# Patient Record
Sex: Female | Born: 1955 | Race: Black or African American | Hispanic: No | Marital: Single | Smoking: Former smoker
Health system: Southern US, Community
[De-identification: ages and names within clinical notes are randomized; demographics above are authoritative.]

## PROBLEM LIST (undated history)

## (undated) DIAGNOSIS — K219 Gastro-esophageal reflux disease without esophagitis: Secondary | ICD-10-CM

## (undated) DIAGNOSIS — F419 Anxiety disorder, unspecified: Secondary | ICD-10-CM

## (undated) DIAGNOSIS — J45909 Unspecified asthma, uncomplicated: Secondary | ICD-10-CM

## (undated) DIAGNOSIS — M1990 Unspecified osteoarthritis, unspecified site: Secondary | ICD-10-CM

## (undated) DIAGNOSIS — I10 Essential (primary) hypertension: Secondary | ICD-10-CM

## (undated) HISTORY — PX: CARPAL TUNNEL RELEASE: SHX101

## (undated) HISTORY — PX: ECTOPIC PREGNANCY SURGERY: SHX613

## (undated) HISTORY — PX: TUBAL LIGATION: SHX77

## (undated) HISTORY — PX: BUNIONECTOMY: SHX129

---

## 2006-07-01 ENCOUNTER — Ambulatory Visit: Payer: Self-pay | Admitting: Unknown Physician Specialty

## 2006-07-13 ENCOUNTER — Ambulatory Visit: Payer: Self-pay | Admitting: Unknown Physician Specialty

## 2007-09-02 ENCOUNTER — Ambulatory Visit: Payer: Self-pay | Admitting: Unknown Physician Specialty

## 2008-01-21 ENCOUNTER — Ambulatory Visit: Payer: Self-pay | Admitting: Gastroenterology

## 2008-02-21 ENCOUNTER — Other Ambulatory Visit: Payer: Self-pay

## 2008-02-21 ENCOUNTER — Ambulatory Visit: Payer: Self-pay | Admitting: Podiatry

## 2008-02-25 ENCOUNTER — Ambulatory Visit: Payer: Self-pay | Admitting: Podiatry

## 2008-04-03 ENCOUNTER — Ambulatory Visit: Payer: Self-pay | Admitting: Podiatry

## 2008-04-07 ENCOUNTER — Ambulatory Visit: Payer: Self-pay | Admitting: Podiatry

## 2009-03-15 ENCOUNTER — Ambulatory Visit: Payer: Self-pay | Admitting: Unknown Physician Specialty

## 2010-12-10 ENCOUNTER — Ambulatory Visit: Payer: Self-pay | Admitting: Unknown Physician Specialty

## 2010-12-13 ENCOUNTER — Ambulatory Visit: Payer: Self-pay | Admitting: Unknown Physician Specialty

## 2012-01-01 ENCOUNTER — Ambulatory Visit: Payer: Self-pay | Admitting: Unknown Physician Specialty

## 2013-04-19 ENCOUNTER — Ambulatory Visit: Payer: Self-pay | Admitting: Physician Assistant

## 2013-06-17 ENCOUNTER — Ambulatory Visit: Payer: Self-pay | Admitting: Gastroenterology

## 2015-05-04 ENCOUNTER — Other Ambulatory Visit: Payer: Self-pay | Admitting: Physician Assistant

## 2015-05-04 ENCOUNTER — Ambulatory Visit
Admission: RE | Admit: 2015-05-04 | Discharge: 2015-05-04 | Disposition: A | Discharge status: Home / Self Care | Payer: 59 | Source: Ambulatory Visit | Attending: Physician Assistant | Admitting: Physician Assistant

## 2015-05-04 DIAGNOSIS — Z1231 Encounter for screening mammogram for malignant neoplasm of breast: Secondary | ICD-10-CM | POA: Diagnosis not present

## 2015-05-08 ENCOUNTER — Ambulatory Visit: Payer: Self-pay

## 2016-05-15 ENCOUNTER — Other Ambulatory Visit: Payer: Self-pay | Admitting: Physician Assistant

## 2016-05-15 DIAGNOSIS — Z1231 Encounter for screening mammogram for malignant neoplasm of breast: Secondary | ICD-10-CM

## 2016-06-16 ENCOUNTER — Other Ambulatory Visit: Payer: Self-pay | Admitting: Physician Assistant

## 2016-06-16 ENCOUNTER — Ambulatory Visit
Admission: RE | Admit: 2016-06-16 | Discharge: 2016-06-16 | Disposition: A | Discharge status: Home / Self Care | Payer: 59 | Source: Ambulatory Visit | Attending: Physician Assistant | Admitting: Physician Assistant

## 2016-06-16 DIAGNOSIS — Z1231 Encounter for screening mammogram for malignant neoplasm of breast: Secondary | ICD-10-CM | POA: Diagnosis present

## 2017-05-28 ENCOUNTER — Other Ambulatory Visit: Payer: Self-pay | Admitting: Physician Assistant

## 2017-05-28 DIAGNOSIS — Z1231 Encounter for screening mammogram for malignant neoplasm of breast: Secondary | ICD-10-CM

## 2017-07-01 ENCOUNTER — Ambulatory Visit
Admission: RE | Admit: 2017-07-01 | Discharge: 2017-07-01 | Disposition: A | Discharge status: Home / Self Care | Payer: 59 | Source: Ambulatory Visit | Attending: Physician Assistant | Admitting: Physician Assistant

## 2017-07-01 DIAGNOSIS — Z1231 Encounter for screening mammogram for malignant neoplasm of breast: Secondary | ICD-10-CM | POA: Diagnosis present

## 2017-10-19 ENCOUNTER — Encounter: Payer: Self-pay | Admitting: Emergency Medicine

## 2017-10-19 ENCOUNTER — Emergency Department
Admission: EM | Admit: 2017-10-19 | Discharge: 2017-10-19 | Disposition: A | Discharge status: Home / Self Care | Payer: 59 | Attending: Emergency Medicine | Admitting: Emergency Medicine

## 2017-10-19 ENCOUNTER — Emergency Department: Payer: 59

## 2017-10-19 DIAGNOSIS — M25511 Pain in right shoulder: Secondary | ICD-10-CM | POA: Insufficient documentation

## 2017-10-19 DIAGNOSIS — I10 Essential (primary) hypertension: Secondary | ICD-10-CM | POA: Diagnosis not present

## 2017-10-19 DIAGNOSIS — M7918 Myalgia, other site: Secondary | ICD-10-CM

## 2017-10-19 DIAGNOSIS — Z87891 Personal history of nicotine dependence: Secondary | ICD-10-CM | POA: Diagnosis not present

## 2017-10-19 DIAGNOSIS — J45909 Unspecified asthma, uncomplicated: Secondary | ICD-10-CM | POA: Diagnosis not present

## 2017-10-19 DIAGNOSIS — M542 Cervicalgia: Secondary | ICD-10-CM | POA: Diagnosis not present

## 2017-10-19 HISTORY — DX: Essential (primary) hypertension: I10

## 2017-10-19 HISTORY — DX: Unspecified asthma, uncomplicated: J45.909

## 2017-10-19 MED ORDER — CYCLOBENZAPRINE HCL 10 MG PO TABS: 10.0000 mg | ORAL_TABLET | Freq: Three times a day (TID) | ORAL | 0 refills | Status: DC | PRN

## 2017-10-19 MED ORDER — IBUPROFEN 600 MG PO TABS
600.0000 mg | ORAL_TABLET | Freq: Once | ORAL | Status: AC
  Administered 2017-10-19: 600 mg via ORAL
  Filled 2017-10-19: qty 1

## 2017-10-19 MED ORDER — IBUPROFEN 600 MG PO TABS: 600.0000 mg | ORAL_TABLET | Freq: Three times a day (TID) | ORAL | 0 refills | Status: DC | PRN

## 2017-10-19 MED ORDER — CYCLOBENZAPRINE HCL 10 MG PO TABS
10.0000 mg | ORAL_TABLET | Freq: Once | ORAL | Status: AC
  Administered 2017-10-19: 10 mg via ORAL
  Filled 2017-10-19: qty 1
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
## 2017-10-19 NOTE — ED Notes (Signed)
Pt verbalizes d/c understanding and rx given. PT in NAD at time of d/c, pt in wheelchair with family to lobby . PT VS stable
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
## 2017-11-27 ENCOUNTER — Other Ambulatory Visit: Payer: Self-pay | Admitting: Student

## 2017-11-27 DIAGNOSIS — M5412 Radiculopathy, cervical region: Secondary | ICD-10-CM

## 2017-12-04 ENCOUNTER — Telehealth: Payer: Self-pay | Admitting: Student

## 2017-12-07 ENCOUNTER — Ambulatory Visit
Admission: RE | Admit: 2017-12-07 | Discharge: 2017-12-07 | Disposition: A | Discharge status: Home / Self Care | Payer: No Typology Code available for payment source | Source: Ambulatory Visit | Attending: Student | Admitting: Student

## 2018-02-10 ENCOUNTER — Other Ambulatory Visit: Payer: Self-pay | Admitting: Physical Medicine and Rehabilitation

## 2018-02-10 DIAGNOSIS — M5412 Radiculopathy, cervical region: Secondary | ICD-10-CM

## 2018-02-20 ENCOUNTER — Ambulatory Visit
Admission: RE | Admit: 2018-02-20 | Discharge: 2018-02-20 | Disposition: A | Discharge status: Home / Self Care | Payer: Managed Care, Other (non HMO) | Source: Ambulatory Visit | Attending: Physical Medicine and Rehabilitation | Admitting: Physical Medicine and Rehabilitation

## 2018-02-20 DIAGNOSIS — M4683 Other specified inflammatory spondylopathies, cervicothoracic region: Secondary | ICD-10-CM | POA: Insufficient documentation

## 2018-02-20 DIAGNOSIS — M5412 Radiculopathy, cervical region: Secondary | ICD-10-CM | POA: Diagnosis present

## 2018-02-20 DIAGNOSIS — M50223 Other cervical disc displacement at C6-C7 level: Secondary | ICD-10-CM | POA: Diagnosis not present

## 2018-02-20 DIAGNOSIS — M4803 Spinal stenosis, cervicothoracic region: Secondary | ICD-10-CM | POA: Diagnosis not present

## 2018-02-20 DIAGNOSIS — M4802 Spinal stenosis, cervical region: Secondary | ICD-10-CM | POA: Insufficient documentation

## 2018-02-20 DIAGNOSIS — M47812 Spondylosis without myelopathy or radiculopathy, cervical region: Secondary | ICD-10-CM | POA: Diagnosis not present

## 2018-02-20 DIAGNOSIS — M542 Cervicalgia: Secondary | ICD-10-CM | POA: Diagnosis present

## 2018-04-20 ENCOUNTER — Other Ambulatory Visit: Payer: Self-pay

## 2018-04-20 ENCOUNTER — Encounter
Admission: RE | Admit: 2018-04-20 | Discharge: 2018-04-20 | Disposition: A | Discharge status: Home / Self Care | Payer: Managed Care, Other (non HMO) | Source: Ambulatory Visit | Attending: Neurosurgery | Admitting: Neurosurgery

## 2018-04-20 DIAGNOSIS — Z01812 Encounter for preprocedural laboratory examination: Secondary | ICD-10-CM | POA: Insufficient documentation

## 2018-04-20 DIAGNOSIS — Z01818 Encounter for other preprocedural examination: Secondary | ICD-10-CM | POA: Diagnosis not present

## 2018-04-20 DIAGNOSIS — R001 Bradycardia, unspecified: Secondary | ICD-10-CM | POA: Diagnosis not present

## 2018-04-20 DIAGNOSIS — Z0183 Encounter for blood typing: Secondary | ICD-10-CM | POA: Diagnosis not present

## 2018-04-20 HISTORY — DX: Anxiety disorder, unspecified: F41.9

## 2018-04-20 HISTORY — DX: Unspecified osteoarthritis, unspecified site: M19.90

## 2018-04-20 HISTORY — DX: Gastro-esophageal reflux disease without esophagitis: K21.9

## 2018-04-20 LAB — TYPE AND SCREEN
ABO/RH(D): O POS
ANTIBODY SCREEN: NEGATIVE

## 2018-04-20 LAB — URINALYSIS, COMPLETE (UACMP) WITH MICROSCOPIC
BACTERIA UA: NONE SEEN
BILIRUBIN URINE: NEGATIVE
Glucose, UA: NEGATIVE mg/dL
Hgb urine dipstick: NEGATIVE
Ketones, ur: NEGATIVE mg/dL
LEUKOCYTES UA: NEGATIVE
Nitrite: NEGATIVE
PROTEIN: 30 mg/dL — AB
SPECIFIC GRAVITY, URINE: 1.023 (ref 1.005–1.030)
pH: 7 (ref 5.0–8.0)

## 2018-04-20 LAB — CBC
HEMATOCRIT: 39 % (ref 35.0–47.0)
Hemoglobin: 13.4 g/dL (ref 12.0–16.0)
MCH: 31.6 pg (ref 26.0–34.0)
MCHC: 34.4 g/dL (ref 32.0–36.0)
MCV: 91.9 fL (ref 80.0–100.0)
Platelets: 220 10*3/uL (ref 150–440)
RBC: 4.24 MIL/uL (ref 3.80–5.20)
RDW: 14 % (ref 11.5–14.5)
WBC: 4.9 10*3/uL (ref 3.6–11.0)

## 2018-04-20 LAB — BASIC METABOLIC PANEL
Anion gap: 5 (ref 5–15)
BUN: 14 mg/dL (ref 6–20)
CALCIUM: 9 mg/dL (ref 8.9–10.3)
CO2: 30 mmol/L (ref 22–32)
CREATININE: 0.9 mg/dL (ref 0.44–1.00)
Chloride: 107 mmol/L (ref 101–111)
GFR calc non Af Amer: >60 mL/min (ref >60)
Glucose, Bld: 102 mg/dL — ABNORMAL HIGH (ref 65–99)
Potassium: 4.1 mmol/L (ref 3.5–5.1)
Sodium: 142 mmol/L (ref 135–145)

## 2018-04-20 LAB — APTT: aPTT: 27 seconds (ref 24–36)

## 2018-04-20 LAB — SURGICAL PCR SCREEN
MRSA, PCR: NEGATIVE
Staphylococcus aureus: NEGATIVE

## 2018-04-20 LAB — PROTIME-INR
INR: 0.87
Prothrombin Time: 11.7 seconds (ref 11.4–15.2)

## 2018-04-20 NOTE — Patient Instructions (Addendum)
Your procedure is scheduled on: 04/28/18 Wed Report to Same Day Surgery 2nd floor medical mall [facility] Entrance-take elevator on left to 2nd floor.  Check in with surgery information desk.) To find out your arrival time please call [phone] between 1PM - 3PM on 04/27/18 Tues  Remember: Instructions that are not followed completely may result in serious medical risk, up to and including death, or upon the discretion of your surgeon and anesthesiologist your surgery may need to be rescheduled.    _x___ 1. Do not eat food after midnight the night before your procedure. You may drink clear liquids up to 2 hours before you are scheduled to arrive at the hospital for your procedure.  Do not drink clear liquids within 2 hours of your scheduled arrival to the hospital.  Clear liquids include  --Water or Apple juice without pulp  --Clear carbohydrate beverage such as ClearFast or Gatorade  --Black Coffee or Clear Tea (No milk, no creamers, do not add anything to                  the coffee or Tea Type 1 and type 2 diabetics should only drink water.  No gum chewing or hard candies.     __x__ 2. No Alcohol for 24 hours before or after surgery.   __x__3. No Smoking or e-cigarettes for 24 prior to surgery.  Do not use any chewable tobacco products for at least 6 hour prior to surgery   ____  4. Bring all medications with you on the day of surgery if instructed.    __x__ 5. Notify your doctor if there is any change in your medical condition     (cold, fever, infections).    x___6. On the morning of surgery brush your teeth with toothpaste and water.  You may rinse your mouth with mouth wash if you wish.  Do not swallow any toothpaste or mouthwash.   Do not wear jewelry, make-up, hairpins, clips or nail polish.  Do not wear lotions, powders, or perfumes. You may wear deodorant.  Do not shave 48 hours prior to surgery. Men may shave face and neck.  Do not bring valuables to the hospital.     [facility] is not responsible for any belongings or valuables.               Contacts, dentures or bridgework may not be worn into surgery.  Leave your suitcase in the car. After surgery it may be brought to your room.  For patients admitted to the hospital, discharge time is determined by your                       treatment team.  _  Patients discharged the day of surgery will not be allowed to drive home.  You will need someone to drive you home and stay with you the night of your procedure.    Please read over the following fact sheets that you were given:   [facility] Preparing for Surgery and or MRSA Information   _x___ Take anti-hypertensive listed below, cardiac, seizure, asthma,     anti-reflux and psychiatric medicines. These include:  1.None  2.  3.  4.  5.  6.  ____Fleets enema or Magnesium Citrate as directed.   _x___ Use CHG Soap or sage wipes as directed on instruction sheet   ____ Use inhalers on the day of surgery and bring to hospital day of surgery  ____  Stop Metformin and Janumet 2 days prior to surgery.    ____ Take 1/2 of usual insulin dose the night before surgery and none on the morning     surgery.   _x___ Follow recommendations from Cardiologist, Pulmonologist or PCP regarding          stopping Aspirin, Coumadin, Plavix ,Eliquis, Effient, or Pradaxa, and Pletal.  X____Stop Anti-inflammatories such as Advil, Aleve, Ibuprofen, Motrin, Naproxen, Naprosyn, Goodies powders or aspirin products. OK to take Tylenol and                          Celebrex.   _x___ Stop supplements until after surgery.  But may continue Vitamin D, Vitamin B,       and multivitamin.   ____ Bring C-Pap to the hospital.

## 2018-04-27 DIAGNOSIS — Z87891 Personal history of nicotine dependence: Secondary | ICD-10-CM | POA: Diagnosis not present

## 2018-04-27 DIAGNOSIS — Z79891 Long term (current) use of opiate analgesic: Secondary | ICD-10-CM | POA: Diagnosis not present

## 2018-04-27 DIAGNOSIS — I10 Essential (primary) hypertension: Secondary | ICD-10-CM | POA: Diagnosis not present

## 2018-04-27 DIAGNOSIS — J45909 Unspecified asthma, uncomplicated: Secondary | ICD-10-CM | POA: Diagnosis not present

## 2018-04-27 DIAGNOSIS — R202 Paresthesia of skin: Secondary | ICD-10-CM | POA: Diagnosis not present

## 2018-04-27 DIAGNOSIS — M5412 Radiculopathy, cervical region: Secondary | ICD-10-CM | POA: Diagnosis present

## 2018-04-27 DIAGNOSIS — Z79890 Hormone replacement therapy (postmenopausal): Secondary | ICD-10-CM | POA: Diagnosis not present

## 2018-04-27 DIAGNOSIS — M79602 Pain in left arm: Secondary | ICD-10-CM | POA: Diagnosis present

## 2018-04-27 DIAGNOSIS — Z79899 Other long term (current) drug therapy: Secondary | ICD-10-CM | POA: Diagnosis not present

## 2018-04-27 MED ORDER — CEFAZOLIN SODIUM-DEXTROSE 2-4 GM/100ML-% IV SOLN
2.0000 g | INTRAVENOUS | Status: AC
  Administered 2018-04-28: 2 g via INTRAVENOUS

## 2018-04-27 NOTE — Consult Note (Signed)
Pharmacy Antibiotic Note  Zoe Fields is a 62 y.o. female scheduled for surgical procedure on 5/29. Pharmacy has been consulted for cefazolin dosing.  Plan: Will order Cefazolin 2g IV x 1 dose to be given 30 minute prior to surgical procedure. Order has been sign and held for 5/29.    Allergies  Allergen Reactions  . Neomycin     hives  . Sulfa Antibiotics     Redness   . Benazepril   . Eryc [Erythromycin]   . Flexeril [Cyclobenzaprine] Other (See Comments)    Hallucinate  . Hyzaar [Losartan Potassium-Hctz] Swelling    Thank you for allowing pharmacy to be a part of this patient's care.  Gardner Candle, PharmD, BCPS Clinical Pharmacist 04/27/2018 2:31 PM

## 2018-04-28 ENCOUNTER — Other Ambulatory Visit: Payer: Self-pay

## 2018-04-28 ENCOUNTER — Observation Stay: Payer: Managed Care, Other (non HMO)

## 2018-04-28 ENCOUNTER — Inpatient Hospital Stay: Payer: Managed Care, Other (non HMO)

## 2018-04-28 ENCOUNTER — Inpatient Hospital Stay: Payer: Managed Care, Other (non HMO) | Admitting: Anesthesiology

## 2018-04-28 ENCOUNTER — Encounter: Payer: Self-pay | Admitting: *Deleted

## 2018-04-28 ENCOUNTER — Encounter
Admission: RE | Disposition: A | Discharge status: Home / Self Care | Payer: Self-pay | Source: Ambulatory Visit | Attending: Neurosurgery

## 2018-04-28 ENCOUNTER — Observation Stay
Admission: RE | Admit: 2018-04-28 | Discharge: 2018-04-29 | Disposition: A | Discharge status: Home / Self Care | Payer: Managed Care, Other (non HMO) | Source: Ambulatory Visit | Attending: Neurosurgery | Admitting: Neurosurgery

## 2018-04-28 DIAGNOSIS — Z981 Arthrodesis status: Secondary | ICD-10-CM

## 2018-04-28 DIAGNOSIS — Z87891 Personal history of nicotine dependence: Secondary | ICD-10-CM | POA: Insufficient documentation

## 2018-04-28 DIAGNOSIS — Z419 Encounter for procedure for purposes other than remedying health state, unspecified: Secondary | ICD-10-CM

## 2018-04-28 DIAGNOSIS — Z79891 Long term (current) use of opiate analgesic: Secondary | ICD-10-CM | POA: Insufficient documentation

## 2018-04-28 DIAGNOSIS — J45909 Unspecified asthma, uncomplicated: Secondary | ICD-10-CM | POA: Insufficient documentation

## 2018-04-28 DIAGNOSIS — R202 Paresthesia of skin: Secondary | ICD-10-CM | POA: Insufficient documentation

## 2018-04-28 DIAGNOSIS — M5412 Radiculopathy, cervical region: Principal | ICD-10-CM | POA: Diagnosis present

## 2018-04-28 DIAGNOSIS — Z79899 Other long term (current) drug therapy: Secondary | ICD-10-CM | POA: Insufficient documentation

## 2018-04-28 DIAGNOSIS — I10 Essential (primary) hypertension: Secondary | ICD-10-CM | POA: Insufficient documentation

## 2018-04-28 DIAGNOSIS — Z79890 Hormone replacement therapy (postmenopausal): Secondary | ICD-10-CM | POA: Insufficient documentation

## 2018-04-28 HISTORY — PX: ANTERIOR CERVICAL DECOMP/DISCECTOMY FUSION: SHX1161

## 2018-04-28 LAB — ABO/RH: ABO/RH(D): O POS

## 2018-04-28 SURGERY — ANTERIOR CERVICAL DECOMPRESSION/DISCECTOMY FUSION 3 LEVELS
Anesthesia: General

## 2018-04-28 MED ORDER — SODIUM CHLORIDE FLUSH 0.9 % IV SOLN
INTRAVENOUS | Status: AC
  Administered 2018-04-28: 10 mL
  Filled 2018-04-28: qty 10

## 2018-04-28 MED ORDER — EPHEDRINE SULFATE 50 MG/ML IJ SOLN
INTRAMUSCULAR | Status: DC | PRN
  Administered 2018-04-28 (×3): 5 mg via INTRAVENOUS

## 2018-04-28 MED ORDER — MEDROXYPROGESTERONE ACETATE 2.5 MG PO TABS
2.5000 mg | ORAL_TABLET | Freq: Every day | ORAL | Status: DC
  Administered 2018-04-28: 2.5 mg via ORAL
  Filled 2018-04-28 (×2): qty 1

## 2018-04-28 MED ORDER — PHENOL 1.4 % MT LIQD
1.0000 | OROMUCOSAL | Status: DC | PRN
  Administered 2018-04-28: 1 via OROMUCOSAL
  Filled 2018-04-28 (×2): qty 177

## 2018-04-28 MED ORDER — ONDANSETRON HCL 4 MG PO TABS: 4.0000 mg | ORAL_TABLET | Freq: Four times a day (QID) | ORAL | Status: DC | PRN

## 2018-04-28 MED ORDER — MENTHOL 3 MG MT LOZG: 1.0000 | LOZENGE | OROMUCOSAL | Status: DC | PRN

## 2018-04-28 MED ORDER — KETAMINE HCL 50 MG/ML IJ SOLN
INTRAMUSCULAR | Status: AC
  Filled 2018-04-28: qty 10

## 2018-04-28 MED ORDER — ACETAMINOPHEN 650 MG RE SUPP: 650.0000 mg | RECTAL | Status: DC | PRN

## 2018-04-28 MED ORDER — FENTANYL CITRATE (PF) 100 MCG/2ML IJ SOLN: 25.0000 ug | INTRAMUSCULAR | Status: DC | PRN

## 2018-04-28 MED ORDER — ESTRADIOL 1 MG PO TABS
1.0000 mg | ORAL_TABLET | Freq: Every day | ORAL | Status: DC
  Administered 2018-04-28: 1 mg via ORAL
  Filled 2018-04-28 (×2): qty 1

## 2018-04-28 MED ORDER — ROCURONIUM BROMIDE 50 MG/5ML IV SOLN
INTRAVENOUS | Status: AC
  Filled 2018-04-28: qty 1

## 2018-04-28 MED ORDER — OXYCODONE HCL 5 MG PO TABS
10.0000 mg | ORAL_TABLET | ORAL | Status: DC | PRN
  Administered 2018-04-29: 10 mg via ORAL
  Filled 2018-04-28: qty 2

## 2018-04-28 MED ORDER — LIDOCAINE HCL (CARDIAC) PF 100 MG/5ML IV SOSY
PREFILLED_SYRINGE | INTRAVENOUS | Status: DC | PRN
  Administered 2018-04-28: 40 mg via INTRAVENOUS

## 2018-04-28 MED ORDER — SODIUM CHLORIDE 0.9 % IR SOLN
Status: DC | PRN
  Administered 2018-04-28: 1000 mL

## 2018-04-28 MED ORDER — ACETAMINOPHEN 325 MG PO TABS: 650.0000 mg | ORAL_TABLET | ORAL | Status: DC | PRN

## 2018-04-28 MED ORDER — BUPIVACAINE-EPINEPHRINE 0.5% -1:200000 IJ SOLN
INTRAMUSCULAR | Status: DC | PRN
  Administered 2018-04-28: 5 mL

## 2018-04-28 MED ORDER — METOPROLOL SUCCINATE ER 25 MG PO TB24
25.0000 mg | ORAL_TABLET | Freq: Every day | ORAL | Status: DC
  Administered 2018-04-28: 25 mg via ORAL
  Filled 2018-04-28: qty 1

## 2018-04-28 MED ORDER — GELATIN ABSORBABLE 12-7 MM EX MISC
CUTANEOUS | Status: AC
  Filled 2018-04-28: qty 1

## 2018-04-28 MED ORDER — METHOCARBAMOL 500 MG PO TABS: 500.0000 mg | ORAL_TABLET | Freq: Four times a day (QID) | ORAL | Status: DC | PRN

## 2018-04-28 MED ORDER — SENNOSIDES-DOCUSATE SODIUM 8.6-50 MG PO TABS: 1.0000 | ORAL_TABLET | Freq: Every evening | ORAL | Status: DC | PRN

## 2018-04-28 MED ORDER — FENTANYL CITRATE (PF) 100 MCG/2ML IJ SOLN
INTRAMUSCULAR | Status: DC | PRN
  Administered 2018-04-28: 100 ug via INTRAVENOUS
  Administered 2018-04-28 (×2): 50 ug via INTRAVENOUS

## 2018-04-28 MED ORDER — BACITRACIN 50000 UNITS IM SOLR
INTRAMUSCULAR | Status: AC
  Filled 2018-04-28: qty 1

## 2018-04-28 MED ORDER — ACETAMINOPHEN 500 MG PO TABS
1000.0000 mg | ORAL_TABLET | Freq: Four times a day (QID) | ORAL | Status: AC
  Administered 2018-04-28 – 2018-04-29 (×3): 1000 mg via ORAL
  Filled 2018-04-28 (×4): qty 2

## 2018-04-28 MED ORDER — LACTATED RINGERS IV SOLN
INTRAVENOUS | Status: DC
  Administered 2018-04-28: 07:00:00 via INTRAVENOUS

## 2018-04-28 MED ORDER — DEXAMETHASONE SODIUM PHOSPHATE 10 MG/ML IJ SOLN
INTRAMUSCULAR | Status: DC | PRN
  Administered 2018-04-28: 10 mg via INTRAVENOUS

## 2018-04-28 MED ORDER — SUGAMMADEX SODIUM 200 MG/2ML IV SOLN
INTRAVENOUS | Status: AC
  Filled 2018-04-28: qty 2

## 2018-04-28 MED ORDER — OXYCODONE HCL 5 MG PO TABS
5.0000 mg | ORAL_TABLET | ORAL | Status: DC | PRN
  Administered 2018-04-28 – 2018-04-29 (×5): 5 mg via ORAL
  Filled 2018-04-28 (×6): qty 1

## 2018-04-28 MED ORDER — DEXAMETHASONE SODIUM PHOSPHATE 4 MG/ML IJ SOLN
8.0000 mg | Freq: Three times a day (TID) | INTRAMUSCULAR | Status: AC
  Administered 2018-04-28 – 2018-04-29 (×3): 8 mg via INTRAVENOUS
  Filled 2018-04-28 (×3): qty 2

## 2018-04-28 MED ORDER — LACTATED RINGERS IV SOLN
INTRAVENOUS | Status: DC | PRN
  Administered 2018-04-28: 08:00:00 via INTRAVENOUS

## 2018-04-28 MED ORDER — LIDOCAINE HCL (PF) 2 % IJ SOLN
INTRAMUSCULAR | Status: AC
Start: 2018-04-28 — End: ?
  Filled 2018-04-28: qty 10

## 2018-04-28 MED ORDER — CEFAZOLIN SODIUM-DEXTROSE 2-4 GM/100ML-% IV SOLN
INTRAVENOUS | Status: AC
  Filled 2018-04-28: qty 100

## 2018-04-28 MED ORDER — FENTANYL CITRATE (PF) 250 MCG/5ML IJ SOLN
INTRAMUSCULAR | Status: AC
  Filled 2018-04-28: qty 5

## 2018-04-28 MED ORDER — DEXAMETHASONE SODIUM PHOSPHATE 10 MG/ML IJ SOLN
INTRAMUSCULAR | Status: AC
  Filled 2018-04-28: qty 1

## 2018-04-28 MED ORDER — BUPIVACAINE-EPINEPHRINE (PF) 0.5% -1:200000 IJ SOLN
INTRAMUSCULAR | Status: AC
  Filled 2018-04-28: qty 30

## 2018-04-28 MED ORDER — FAMOTIDINE 20 MG PO TABS
ORAL_TABLET | ORAL | Status: AC
  Filled 2018-04-28: qty 1

## 2018-04-28 MED ORDER — SODIUM CHLORIDE 0.9 % IV SOLN
INTRAVENOUS | Status: DC | PRN
  Administered 2018-04-28: 40 ug/min via INTRAVENOUS

## 2018-04-28 MED ORDER — AMLODIPINE BESYLATE 5 MG PO TABS
5.0000 mg | ORAL_TABLET | Freq: Every day | ORAL | Status: DC
Start: 2018-04-28 — End: 2018-04-29
  Administered 2018-04-28: 5 mg via ORAL
  Filled 2018-04-28: qty 1

## 2018-04-28 MED ORDER — GABAPENTIN 100 MG PO CAPS
100.0000 mg | ORAL_CAPSULE | Freq: Every day | ORAL | Status: DC
  Administered 2018-04-28: 100 mg via ORAL
  Filled 2018-04-28: qty 1

## 2018-04-28 MED ORDER — SODIUM CHLORIDE 0.9 % IV SOLN: 250.0000 mL | INTRAVENOUS | Status: DC

## 2018-04-28 MED ORDER — CITALOPRAM HYDROBROMIDE 20 MG PO TABS
10.0000 mg | ORAL_TABLET | Freq: Every day | ORAL | Status: DC
  Administered 2018-04-28: 10 mg via ORAL
  Filled 2018-04-28: qty 1

## 2018-04-28 MED ORDER — FAMOTIDINE 20 MG PO TABS
20.0000 mg | ORAL_TABLET | Freq: Once | ORAL | Status: AC
  Administered 2018-04-28: 20 mg via ORAL

## 2018-04-28 MED ORDER — HYDROMORPHONE HCL 1 MG/ML IJ SOLN: 0.5000 mg | INTRAMUSCULAR | Status: DC | PRN

## 2018-04-28 MED ORDER — SUCCINYLCHOLINE CHLORIDE 20 MG/ML IJ SOLN
INTRAMUSCULAR | Status: AC
  Filled 2018-04-28: qty 1

## 2018-04-28 MED ORDER — METHOCARBAMOL 1000 MG/10ML IJ SOLN
500.0000 mg | Freq: Four times a day (QID) | INTRAVENOUS | Status: DC | PRN
  Filled 2018-04-28: qty 5

## 2018-04-28 MED ORDER — TRIAMTERENE-HCTZ 37.5-25 MG PO TABS
1.0000 | ORAL_TABLET | Freq: Every day | ORAL | Status: DC | PRN
  Filled 2018-04-28: qty 1

## 2018-04-28 MED ORDER — ROCURONIUM BROMIDE 100 MG/10ML IV SOLN
INTRAVENOUS | Status: DC | PRN
  Administered 2018-04-28: 5 mg via INTRAVENOUS
  Administered 2018-04-28: 25 mg via INTRAVENOUS
  Administered 2018-04-28: 10 mg via INTRAVENOUS

## 2018-04-28 MED ORDER — PROPOFOL 10 MG/ML IV BOLUS
INTRAVENOUS | Status: AC
  Filled 2018-04-28: qty 40

## 2018-04-28 MED ORDER — OXYCODONE HCL 5 MG/5ML PO SOLN: 5.0000 mg | Freq: Once | ORAL | Status: DC | PRN

## 2018-04-28 MED ORDER — KETAMINE HCL 50 MG/ML IJ SOLN
INTRAMUSCULAR | Status: DC | PRN
  Administered 2018-04-28: 50 mg via INTRAVENOUS

## 2018-04-28 MED ORDER — ONDANSETRON HCL 4 MG/2ML IJ SOLN
INTRAMUSCULAR | Status: AC
  Filled 2018-04-28: qty 2

## 2018-04-28 MED ORDER — SUCCINYLCHOLINE CHLORIDE 20 MG/ML IJ SOLN
INTRAMUSCULAR | Status: DC | PRN
  Administered 2018-04-28: 100 mg via INTRAVENOUS

## 2018-04-28 MED ORDER — SODIUM CHLORIDE 0.9% FLUSH
3.0000 mL | Freq: Two times a day (BID) | INTRAVENOUS | Status: DC
  Administered 2018-04-28 – 2018-04-29 (×2): 3 mL via INTRAVENOUS

## 2018-04-28 MED ORDER — PHENYLEPHRINE HCL 10 MG/ML IJ SOLN
INTRAMUSCULAR | Status: DC | PRN
  Administered 2018-04-28 (×4): 100 ug via INTRAVENOUS

## 2018-04-28 MED ORDER — OXYCODONE HCL 5 MG PO TABS: 5.0000 mg | ORAL_TABLET | Freq: Once | ORAL | Status: DC | PRN

## 2018-04-28 MED ORDER — SODIUM CHLORIDE 0.9 % IV SOLN
INTRAVENOUS | Status: DC
  Administered 2018-04-28: 15:00:00 via INTRAVENOUS

## 2018-04-28 MED ORDER — SODIUM CHLORIDE 0.9% FLUSH: 3.0000 mL | INTRAVENOUS | Status: DC | PRN

## 2018-04-28 MED ORDER — ONDANSETRON HCL 4 MG/2ML IJ SOLN
INTRAMUSCULAR | Status: DC | PRN
  Administered 2018-04-28: 4 mg via INTRAVENOUS

## 2018-04-28 MED ORDER — MIDAZOLAM HCL 2 MG/2ML IJ SOLN
INTRAMUSCULAR | Status: AC
  Filled 2018-04-28: qty 2

## 2018-04-28 MED ORDER — PROPOFOL 10 MG/ML IV BOLUS
INTRAVENOUS | Status: DC | PRN
  Administered 2018-04-28: 170 mg via INTRAVENOUS
  Administered 2018-04-28: 30 mg via INTRAVENOUS

## 2018-04-28 MED ORDER — ONDANSETRON HCL 4 MG/2ML IJ SOLN: 4.0000 mg | Freq: Four times a day (QID) | INTRAMUSCULAR | Status: DC | PRN

## 2018-04-28 MED ORDER — MIDAZOLAM HCL 2 MG/2ML IJ SOLN
INTRAMUSCULAR | Status: DC | PRN
  Administered 2018-04-28: 2 mg via INTRAVENOUS

## 2018-04-28 MED ORDER — SUGAMMADEX SODIUM 200 MG/2ML IV SOLN
INTRAVENOUS | Status: DC | PRN
  Administered 2018-04-28: 150 mg via INTRAVENOUS

## 2018-04-28 MED ORDER — GELATIN ABSORBABLE 12-7 MM EX MISC
CUTANEOUS | Status: DC | PRN
  Administered 2018-04-28: 1 via TOPICAL

## 2018-04-28 MED ORDER — SODIUM CHLORIDE FLUSH 0.9 % IV SOLN
INTRAVENOUS | Status: AC
  Filled 2018-04-28: qty 10

## 2018-04-28 SURGICAL SUPPLY — 61 items
ALLOGRAFT LORDOTIC CC 7X11X14 (Bone Implant) ×6 IMPLANT
BAND RUBBER 3X1/6 TAN STRL (MISCELLANEOUS) IMPLANT
BLADE BOVIE TIP EXT 4 (BLADE) ×3 IMPLANT
BLADE SURG 15 STRL LF DISP TIS (BLADE) ×1 IMPLANT
BLADE SURG 15 STRL SS (BLADE) ×2
BULB RESERV EVAC DRAIN JP 100C (MISCELLANEOUS) IMPLANT
BUR NEURO DRILL SOFT 3.0X3.8M (BURR) ×3 IMPLANT
CANISTER SUCT 1200ML W/VALVE (MISCELLANEOUS) ×6 IMPLANT
CHLORAPREP W/TINT 26ML (MISCELLANEOUS) ×3 IMPLANT
CLOSURE WOUND 1/2 X4 (GAUZE/BANDAGES/DRESSINGS)
COUNTER NEEDLE 20/40 LG (NEEDLE) ×3 IMPLANT
COVER LIGHT HANDLE STERIS (MISCELLANEOUS) ×6 IMPLANT
CRADLE LAMINECT ARM (MISCELLANEOUS) ×3 IMPLANT
CUP MEDICINE 2OZ PLAST GRAD ST (MISCELLANEOUS) ×6 IMPLANT
DERMABOND ADVANCED (GAUZE/BANDAGES/DRESSINGS) ×2
DERMABOND ADVANCED .7 DNX12 (GAUZE/BANDAGES/DRESSINGS) ×1 IMPLANT
DRAIN CHANNEL JP 10F RND 20C F (MISCELLANEOUS) IMPLANT
DRAPE C-ARM 42X72 X-RAY (DRAPES) ×6 IMPLANT
DRAPE INCISE IOBAN 66X45 STRL (DRAPES) ×3 IMPLANT
DRAPE LAPAROTOMY 77X122 PED (DRAPES) ×3 IMPLANT
DRAPE MICROSCOPE SPINE 48X150 (DRAPES) ×3 IMPLANT
DRAPE POUCH INSTRU U-SHP 10X18 (DRAPES) ×3 IMPLANT
DRAPE SHEET LG 3/4 BI-LAMINATE (DRAPES) ×3 IMPLANT
DRAPE SURG 17X11 SM STRL (DRAPES) ×12 IMPLANT
DRAPE TABLE BACK 80X90 (DRAPES) ×3 IMPLANT
ELECT CAUTERY BLADE TIP 2.5 (TIP) ×3
ELECTRODE CAUTERY BLDE TIP 2.5 (TIP) ×1 IMPLANT
FEE INTRAOP MONITOR IMPULS NCS (MISCELLANEOUS) IMPLANT
FRAME EYE SHIELD (PROTECTIVE WEAR) ×3 IMPLANT
GAUZE SPONGE 4X4 12PLY STRL (GAUZE/BANDAGES/DRESSINGS) ×3 IMPLANT
GLOVE SURG SYN 8.5  E (GLOVE) ×6
GLOVE SURG SYN 8.5 E (GLOVE) ×3 IMPLANT
GOWN SRG XL LVL 3 NONREINFORCE (GOWNS) ×1 IMPLANT
GOWN STRL NON-REIN TWL XL LVL3 (GOWNS) ×2
GOWN STRL REUS W/ TWL LRG LVL3 (GOWN DISPOSABLE) ×1 IMPLANT
GOWN STRL REUS W/TWL LRG LVL3 (GOWN DISPOSABLE) ×2
GRADUATE 1200CC STRL 31836 (MISCELLANEOUS) ×3 IMPLANT
INTRAOP MONITOR FEE IMPULS NCS (MISCELLANEOUS)
INTRAOP MONITOR FEE IMPULSE (MISCELLANEOUS)
IV CATH ANGIO 12GX3 LT BLUE (NEEDLE) ×3 IMPLANT
KIT TURNOVER KIT A (KITS) ×3 IMPLANT
MARKER SKIN DUAL TIP RULER LAB (MISCELLANEOUS) ×3 IMPLANT
NEEDLE HYPO 22GX1.5 SAFETY (NEEDLE) ×3 IMPLANT
NS IRRIG 1000ML POUR BTL (IV SOLUTION) ×3 IMPLANT
PACK LAMINECTOMY NEURO (CUSTOM PROCEDURE TRAY) ×3 IMPLANT
PIN CASPAR 14 (PIN) ×1 IMPLANT
PIN CASPAR 14MM (PIN) ×3
PLATE ARCHON 2-LEVEL 38MM (Plate) ×3 IMPLANT
SCREW ARCHON SD VAR 4.0X15MM (Screw) ×18 IMPLANT
SPOGE SURGIFLO 8M (HEMOSTASIS) ×2
SPONGE KITTNER 5P (MISCELLANEOUS) ×3 IMPLANT
SPONGE SURGIFLO 8M (HEMOSTASIS) ×1 IMPLANT
STRIP CLOSURE SKIN 1/2X4 (GAUZE/BANDAGES/DRESSINGS) IMPLANT
SUT V-LOC 90 ABS DVC 3-0 CL (SUTURE) ×3 IMPLANT
SUT VIC AB 3-0 SH 8-18 (SUTURE) ×3 IMPLANT
SYR 30ML LL (SYRINGE) ×3 IMPLANT
TAPE CLOTH 3X10 WHT NS LF (GAUZE/BANDAGES/DRESSINGS) ×3 IMPLANT
TOWEL OR 17X26 4PK STRL BLUE (TOWEL DISPOSABLE) ×12 IMPLANT
TRAY FOLEY MTR SLVR 16FR STAT (SET/KITS/TRAYS/PACK) IMPLANT
TUBING CONNECTING 10 (TUBING) ×2 IMPLANT
TUBING CONNECTING 10' (TUBING) ×1

## 2018-04-28 NOTE — Anesthesia Procedure Notes (Signed)
Procedure Name: Intubation Date/Time: 04/28/2018 7:22 AM Performed by: Mathews Argyle, CRNA Pre-anesthesia Checklist: Patient identified, Emergency Drugs available, Suction available, Patient being monitored and Timeout performed Patient Re-evaluated:Patient Re-evaluated prior to induction Oxygen Delivery Method: Circle system utilized Preoxygenation: Pre-oxygenation with 100% oxygen Induction Type: IV induction Ventilation: Mask ventilation without difficulty Laryngoscope Size: McGraph and 3 Grade View: Grade I Tube type: Oral Tube size: 7.0 mm Number of attempts: 1 Airway Equipment and Method: Stylet and Video-laryngoscopy Placement Confirmation: ETT inserted through vocal cords under direct vision,  positive ETCO2 and breath sounds checked- equal and bilateral Secured at: 22 cm Tube secured with: Tape Dental Injury: Teeth and Oropharynx as per pre-operative assessment  Comments: Head and neck maintained in neutral position, no difficulty placing ETT with McGrath, teeth and lips in preop condition

## 2018-04-28 NOTE — Anesthesia Postprocedure Evaluation (Signed)
Anesthesia Post Note  Patient: Zoe Fields  Procedure(s) Performed: ANTERIOR CERVICAL DECOMPRESSION/DISCECTOMY FUSION 3 LEVELS-C6-T1 (N/A )  Patient location during evaluation: PACU Anesthesia Type: General Level of consciousness: awake and alert Pain management: pain level controlled Vital Signs Assessment: post-procedure vital signs reviewed and stable Respiratory status: spontaneous breathing, nonlabored ventilation, respiratory function stable and patient connected to nasal cannula oxygen Cardiovascular status: blood pressure returned to baseline and stable Postop Assessment: no apparent nausea or vomiting Anesthetic complications: no     Last Vitals:  Vitals:   04/28/18 1023 04/28/18 1038  BP: 139/71 139/81  Pulse: 68 67  Resp:    Temp:  36.5 C  SpO2: 98% 97%    Last Pain:  Vitals:   04/28/18 1113  TempSrc:   PainSc: 0-No pain                 Cleda Mccreedy Piscitello

## 2018-04-28 NOTE — Anesthesia Preprocedure Evaluation (Signed)
Anesthesia Evaluation  Patient identified by MRN, date of birth, ID band Patient awake    Reviewed: Allergy & Precautions, H&P , NPO status , Patient's Chart, lab work & pertinent test results  History of Anesthesia Complications Negative for: history of anesthetic complications  Airway Mallampati: II  TM Distance: >3 FB Neck ROM: limited    Dental  (+) Chipped, Poor Dentition   Pulmonary neg pulmonary ROS, neg shortness of breath, asthma , former smoker,           Cardiovascular Exercise Tolerance: Good hypertension, (-) angina(-) Past MI and (-) DOE      Neuro/Psych Anxiety negative neurological ROS  negative psych ROS   GI/Hepatic Neg liver ROS, GERD  Medicated and Controlled,  Endo/Other  negative endocrine ROS  Renal/GU      Musculoskeletal  (+) Arthritis ,   Abdominal   Peds  Hematology negative hematology ROS (+)   Anesthesia Other Findings Past Medical History: No date: Anxiety No date: Arthritis No date: Asthma No date: GERD (gastroesophageal reflux disease) No date: Hypertension  Past Surgical History: No date: BUNIONECTOMY; Bilateral No date: CARPAL TUNNEL RELEASE No date: CARPAL TUNNEL RELEASE; Right No date: ECTOPIC PREGNANCY SURGERY No date: TUBAL LIGATION  BMI    Body Mass Index:  29.19 kg/m      Reproductive/Obstetrics negative OB ROS (+) Breast feeding                              Anesthesia Physical Anesthesia Plan  ASA: III  Anesthesia Plan: General ETT   Post-op Pain Management:    Induction: Intravenous  PONV Risk Score and Plan: Propofol infusion, Ondansetron, Dexamethasone and Midazolam  Airway Management Planned: Oral ETT and Video Laryngoscope Planned  Additional Equipment:   Intra-op Plan:   Post-operative Plan: Extubation in OR  Informed Consent: I have reviewed the patients History and Physical, chart, labs and discussed the  procedure including the risks, benefits and alternatives for the proposed anesthesia with the patient or authorized representative who has indicated his/her understanding and acceptance.   Dental Advisory Given  Plan Discussed with: Anesthesiologist, CRNA and Surgeon  Anesthesia Plan Comments: (Patient consented for risks of anesthesia including but not limited to:  - adverse reactions to medications - damage to teeth, lips or other oral mucosa - sore throat or hoarseness - Damage to heart, brain, lungs or loss of life  Patient voiced understanding.)        Anesthesia Quick Evaluation

## 2018-04-28 NOTE — Progress Notes (Signed)
    Attending Progress Note  History: Zoe Fields is here for cervical radiculopathy, POD0 s/p C6-T1 ACDF. Arm pain has improved. She is feeling well.   Physical Exam: Vitals:   04/28/18 1023 04/28/18 1038  BP: 139/71 139/81  Pulse: 68 67  Resp:    Temp:  97.7 F (36.5 C)  SpO2: 98% 97%    AA Ox3 CNI  Strength:5/5 throughout except L triceps and biceps 4+/5  Data:  No results for input(s): NA, K, CL, CO2, BUN, CREATININE, LABGLOM, GLUCOSE, CALCIUM in the last 168 hours. No results for input(s): AST, ALT, ALKPHOS in the last 168 hours.  Invalid input(s): TBILI   No results for input(s): WBC, HGB, HCT, PLT in the last 168 hours. No results for input(s): APTT, INR in the last 168 hours.       Other tests/results: radiographs pending  Assessment/Plan:  Zoe Fields is doing well after ACDF.  - mobilize - pain control - DVT prophylaxis   Venetia Night MD, [facility] Department of Neurosurgery

## 2018-04-28 NOTE — Anesthesia Post-op Follow-up Note (Signed)
Anesthesia QCDR form completed.        

## 2018-04-28 NOTE — Transfer of Care (Signed)
Immediate Anesthesia Transfer of Care Note  Patient: Zoe Fields  Procedure(s) Performed: ANTERIOR CERVICAL DECOMPRESSION/DISCECTOMY FUSION 3 LEVELS-C6-T1 (N/A )  Patient Location: PACU  Anesthesia Type:General  Level of Consciousness: awake  Airway & Oxygen Therapy: Patient Spontanous Breathing and Patient connected to face mask oxygen  Post-op Assessment: Report given to RN and Post -op Vital signs reviewed and stable  Post vital signs: Reviewed  Last Vitals:  Vitals Value Taken Time  BP 129/80 04/28/2018  9:53 AM  Temp 36.5 C 04/28/2018  9:53 AM  Pulse 84 04/28/2018  9:53 AM  Resp 12 04/28/2018  9:53 AM  SpO2 100 % 04/28/2018  9:53 AM    Last Pain:  Vitals:   04/28/18 0616  TempSrc: Temporal  PainSc: 0-No pain         Complications: No apparent anesthesia complications
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
## 2018-04-28 NOTE — OR Nursing (Signed)
Good strength upper and lower ext.

## 2018-04-28 NOTE — Op Note (Signed)
Indications: [name] is a 62 yo female who presented with cervical radiculopathy that did not improve with conservative management.  She elected for surgical intervention  Findings: Correction of C7-T1 anterolisthesis  Preoperative Diagnosis: Cervical radiculopathy Postoperative Diagnosis: same   EBL: 100 ml IVF: 700 ml Drains: none Disposition: Extubated and Stable to PACU Complications: none  No foley catheter was placed.   Preoperative Note:   Risks of surgery discussed include: infection, bleeding, stroke, coma, death, paralysis, CSF leak, nerve/spinal cord injury, numbness, tingling, weakness, complex regional pain syndrome, recurrent stenosis and/or disc herniation, vascular injury, development of instability, neck/back pain, need for further surgery, persistent symptoms, development of deformity, and the risks of anesthesia. The patient understood these risks and agreed to proceed.   Procedure:  1) Anterior cervical diskectomy and fusion at C6/7 and C7/T1 2) Anterior cervical instrumentation at C6 - T1 3) Placement of structural allograft  4) Use of operative microscope 5) Use of flouroscopy   Procedure: After obtaining informed consent, the patient taken to the operating room, placed in supine position, general anesthesia induced.  The patient had a small shoulder roll placed behind their shoulders.  The patient received preop antibiotics and IV Decadron.  The patient had a neck incision outlined, was prepped and draped in usual sterile fashion. The incision was injected with local anesthetic.   An incision was opened, dissection taken down medial to the carotid artery and jugular vein, lateral to the trachea and esophagus.  The prevertebral fascia identified and a localizing x-ray demonstrated the correct level.  The longus colli were dissected laterally, and self-retaining retractors placed to open the operative field. The microscope was then brought into the field.  With  this complete, distractor pins were placed in the vertebral bodies of C6 and T1. The distractor was placed, and the anuli at C6/7 and C7/T1 were opened using a bovie.  Curettes and pituitary rongeurs used to remove the majority of disk, then the drill was used to remove the posterior osteophyte and begin the foraminotomies. The nerve hook was used to elevate the posterior longitudinal ligament, which was then removed with Kerrison rongeurs. The microblunt nerve hook could be passed out the foramen bilaterally at each level.   Meticulous hemostasis was obtained.  Structural allograft coated with demineralized bone matrix was tapped behind the anterior lip of the vertebral body at C6/7 (7 mm) and C7/T1 (7 mm).    The caspar distractor was removed, and bone wax used for hemostasis. A separate, 38 mm Nuvasive Archon plate was chosen.  Two screws placed in each vertebral body, respectively making sure the screws were behind the locking mechanism.  Final AP and lateral radiographs were taken.   With everything in good position, the wound was irrigated copiously with bacitracin-containing solution and meticulous hemostasis obtained.  Wound was closed in 2 layers using interrupted inverted 3-0 Vicryl sutures.  The wound was dressed with dermabond, the head of bed at 30 degrees, taken to recovery room in stable condition.  No new postop neurological deficits were identified.  Sponge and pattie counts were correct at the end of the procedure.    I performed the entire procedure with the assistance of Ivar Drape PA as an Designer, television/film set.  Venetia Night MD

## 2018-04-29 ENCOUNTER — Encounter: Payer: Self-pay | Admitting: Neurosurgery

## 2018-04-29 DIAGNOSIS — M5412 Radiculopathy, cervical region: Secondary | ICD-10-CM | POA: Diagnosis not present

## 2018-04-29 MED ORDER — METHOCARBAMOL 500 MG PO TABS: 500.0000 mg | ORAL_TABLET | Freq: Four times a day (QID) | ORAL | 0 refills | Status: DC | PRN

## 2018-04-29 MED ORDER — OXYCODONE HCL 5 MG PO TABS: 5.0000 mg | ORAL_TABLET | ORAL | 0 refills | Status: DC | PRN

## 2018-04-29 NOTE — Progress Notes (Signed)
Discharge instructions and prescriptions given to pt. IVs removed. No questions from pt at this time. Pt dressed. Awaiting ride for discharge home.

## 2018-04-29 NOTE — Discharge Instructions (Signed)

## 2018-04-29 NOTE — Discharge Summary (Signed)
Procedure: C6-T1 ACDF Procedure Date 04/28/2018 Diagnosis: cervical radiculopathy  History: Zoe Fields is POD1 s/p 2 level ACDF. She is recovering well. Left arm pain prior to surgery has resolved. Complains of tingling in 3rd-5th digits of left hand that at times feels throbbing. She is having some throat discomfort but is able to tolerate liquids and soft food without issue. She has been able to ambulate and void without issue.  Current pain rated 4/10 which is primarily concerning throat discomfort.    Physical Exam: Vitals:   04/29/18 0434 04/29/18 0741  BP: 115/78 121/74  Pulse: (!) 59 (!) 51  Resp: 18   Temp: 98.7 F (37.1 C) 98.6 F (37 C)  SpO2: 99% 98%    AA Ox3 CNI Skin: Incision site intact. Glue present Strength:5/5 upper extremities.   Data:  No results for input(s): NA, K, CL, CO2, BUN, CREATININE, LABGLOM, GLUCOSE, CALCIUM in the last 168 hours. No results for input(s): AST, ALT, ALKPHOS in the last 168 hours.  Invalid input(s): TBILI   No results for input(s): WBC, HGB, HCT, PLT in the last 168 hours. No results for input(s): APTT, INR in the last 168 hours.       Other tests/results:  EXAM: CERVICAL SPINE - 2-3 VIEW  COMPARISON:  04/28/2018 and prior studies  FINDINGS: Anterior plate/screw fixation and interbody fusion material identified at C6-C7-T1.  No definite complicating features are noted.  Mild degenerative disc disease/spondylosis and facet arthropathy from C3-C6 again noted.  IMPRESSION: Anterior/interbody fusion from C6-T1 without definite complicating features.   Assessment/Plan:  Zoe Fields is POD1 s/p 2 level ACDF. Overall she is doing very well. Advised to keep head elevated in order to decrease swelling and help with throat discomfort. Also advised to continue soft diet until discomfort subsides. Pain prior to surgery has resolved and strength has improved. Will continue pain management with tylenol, oxycodone,  and robaxin PRN.  2 week follow up in clinic to monitor progress.  Post op activity restrictions and wound care discussed with patient.  All questions and concerns addressed. Advised to contact office is additional questions or concerns arise.    Ivar Drape PA-C Department of Neurosurgery

## 2018-06-10 ENCOUNTER — Other Ambulatory Visit: Payer: Self-pay | Admitting: Physician Assistant

## 2018-06-10 DIAGNOSIS — Z1231 Encounter for screening mammogram for malignant neoplasm of breast: Secondary | ICD-10-CM

## 2018-07-02 ENCOUNTER — Ambulatory Visit
Admission: RE | Admit: 2018-07-02 | Discharge: 2018-07-02 | Disposition: A | Discharge status: Home / Self Care | Payer: Managed Care, Other (non HMO) | Source: Ambulatory Visit | Attending: Physician Assistant | Admitting: Physician Assistant

## 2018-07-02 DIAGNOSIS — Z1231 Encounter for screening mammogram for malignant neoplasm of breast: Secondary | ICD-10-CM

## 2018-07-20 ENCOUNTER — Other Ambulatory Visit: Payer: Self-pay | Admitting: Sports Medicine

## 2018-07-20 DIAGNOSIS — M25511 Pain in right shoulder: Secondary | ICD-10-CM

## 2018-08-05 ENCOUNTER — Ambulatory Visit: Payer: Managed Care, Other (non HMO)

## 2018-08-06 ENCOUNTER — Ambulatory Visit
Admission: RE | Admit: 2018-08-06 | Discharge: 2018-08-06 | Disposition: A | Discharge status: Home / Self Care | Payer: Managed Care, Other (non HMO) | Source: Ambulatory Visit | Attending: Sports Medicine | Admitting: Sports Medicine

## 2018-08-06 ENCOUNTER — Other Ambulatory Visit: Payer: Self-pay

## 2018-08-06 ENCOUNTER — Telehealth: Payer: Self-pay | Admitting: Radiology

## 2018-08-06 DIAGNOSIS — M12811 Other specific arthropathies, not elsewhere classified, right shoulder: Secondary | ICD-10-CM | POA: Insufficient documentation

## 2018-08-06 DIAGNOSIS — M25511 Pain in right shoulder: Secondary | ICD-10-CM | POA: Diagnosis present

## 2018-08-06 DIAGNOSIS — M75121 Complete rotator cuff tear or rupture of right shoulder, not specified as traumatic: Secondary | ICD-10-CM | POA: Insufficient documentation

## 2018-08-23 ENCOUNTER — Ambulatory Visit: Payer: No Typology Code available for payment source

## 2018-08-23 ENCOUNTER — Other Ambulatory Visit: Payer: Self-pay

## 2018-08-23 ENCOUNTER — Encounter
Admission: RE | Admit: 2018-08-23 | Discharge: 2018-08-23 | Disposition: A | Discharge status: Home / Self Care | Payer: Managed Care, Other (non HMO) | Source: Ambulatory Visit | Attending: Orthopedic Surgery | Admitting: Orthopedic Surgery

## 2018-08-23 DIAGNOSIS — Z01812 Encounter for preprocedural laboratory examination: Secondary | ICD-10-CM | POA: Insufficient documentation

## 2018-08-23 LAB — BASIC METABOLIC PANEL
ANION GAP: 5 (ref 5–15)
BUN: 16 mg/dL (ref 8–23)
CALCIUM: 8.4 mg/dL — AB (ref 8.9–10.3)
CHLORIDE: 108 mmol/L (ref 98–111)
CO2: 27 mmol/L (ref 22–32)
Creatinine, Ser: 0.83 mg/dL (ref 0.44–1.00)
GFR calc non Af Amer: >60 mL/min (ref >60)
Glucose, Bld: 104 mg/dL — ABNORMAL HIGH (ref 70–99)
Potassium: 3.5 mmol/L (ref 3.5–5.1)
Sodium: 140 mmol/L (ref 135–145)

## 2018-08-23 NOTE — Pre-Procedure Instructions (Signed)
cbc Component Value Ref Range Performed At Pathologist Signature  White Blood Cell Count - [facility] 5.3 3.4 - 10.8 x10E3/uL [facility]   Red Blood Cell Count - [facility] 4.82 3.77 - 5.28 x10E6/uL [facility]   Hemoglobin - [facility] 14.9 11.1 - 15.9 g/dL [facility]   Hematocrit - [facility] 43.5 34.0 - 46.6 % [facility]   MCV - [facility] 90 79 - 97 fL [facility]   MCH - [facility] 30.9 26.6 - 33.0 pg [facility]   MCHC - [facility] 34.3 31.5 - 35.7 g/dL [facility]   RDW - [facility] 14.0 12.3 - 15.4 % [facility]   Platelet Count - [facility] 317 150 - 450 x10E3/uL [facility]   Neutrophils - [facility] 46 Not Estab. % [facility]   LYMPHS - [facility] 41 Not Estab. % [facility]   Monocytes - [facility] 10 Not Estab. % [facility]   Eos - [facility] 3 Not Estab. % [facility]   Basos - [facility] 0 Not Estab. % [facility]   Neutrophils (Absolute) - [facility] 2.4 1.4 - 7.0 x10E3/uL [facility]   Lymphs (Absolute) - [facility] 2.2 0.7 - 3.1 x10E3/uL [facility]   Monocytes(Absolute) - [facility] 0.6 0.1 - 0.9 x10E3/uL [facility]   Eos (Absolute) - [facility] 0.2 0.0 - 0.4 x10E3/uL [facility]   Baso (Absolute) - [facility] 0.0 0.0 - 0.2 x10E3/uL [facility]   Immature Granulocytes - [facility] 0 Not Estab. % [facility]   Immature Grans (Abs) - [facility] 0.0 0.0 - 0.1 x10E3/uL [facility]    [facility] 07-2018

## 2018-08-23 NOTE — Patient Instructions (Signed)
Your procedure is scheduled on: Monday August 30, 2018 Report to Day Surgery [facility]nd [facility] To find out your arrival time, please call [phone] between 1PM - 3PM on: Friday August 27, 2018  REMEMBER: Instructions that are not followed completely may result in serious medical risk, up to and including death; or upon the discretion of your surgeon and anesthesiologist your surgery may need to be rescheduled.  Do not eat food after midnight the night before surgery.  No gum chewing, lozengers or hard candies.  You may however, drink CLEAR liquids up to 2 hours before you are scheduled to arrive for your surgery. Do not drink anything within 2 hours of the start of your surgery.  Clear liquids include: - water   Do NOT drink anything that is not on this list.  Type 1 and Type 2 diabetics should only drink water.  No Alcohol for 24 hours before or after surgery.  No Smoking including e-cigarettes for 24 hours prior to surgery.  No chewable tobacco products for at least 6 hours prior to surgery.  No nicotine patches on the day of surgery.  On the morning of surgery brush your teeth with toothpaste and water, you may rinse your mouth with mouthwash if you wish. Do not swallow any toothpaste or mouthwash.  Notify your doctor if there is any change in your medical condition (cold, fever, infection).  Do not wear jewelry, make-up, hairpins, clips or nail polish.  Do not wear lotions, powders, or perfumes. You may NOT wear deodorant.  Do not shave 48 hours prior to surgery. Men may shave face and neck.  Contacts and dentures may not be worn into surgery.  Do not bring valuables to the hospital, including drivers license, insurance or credit cards.   is not responsible for any belongings or valuables.   TAKE THESE MEDICATIONS THE MORNING OF SURGERY: LYRICA   Use CHG Soap  as directed on instruction sheet.  Stop Anti-inflammatories  (NSAIDS) such as Advil, Aleve, Ibuprofen, Motrin, Naproxen, Naprosyn and Aspirin based products such as Excedrin, Goodys Powder, BC Powder. (May take Tylenol or Acetaminophen if needed.)  Stop ANY OVER THE COUNTER supplements until after surgery. (May continue Vitamin D, Vitamin B, and multivitamin.)  Wear comfortable clothing (specific to your surgery type) to the hospital.  Plan for stool softeners for home use.  If you are being discharged the day of surgery, you will not be allowed to drive home. You will need a responsible adult to drive you home and stay with you that night.   If you are taking public transportation, you will need to have a responsible adult with you. Please confirm with your physician that it is acceptable to use public transportation.   Please call [phone] if you have any questions about these instructions.

## 2018-08-29 MED ORDER — CEFAZOLIN SODIUM-DEXTROSE 2-4 GM/100ML-% IV SOLN
2.0000 g | Freq: Once | INTRAVENOUS | Status: AC
  Administered 2018-08-30: 2 g via INTRAVENOUS

## 2018-08-30 ENCOUNTER — Other Ambulatory Visit: Payer: Self-pay

## 2018-08-30 ENCOUNTER — Ambulatory Visit: Payer: Managed Care, Other (non HMO) | Admitting: Certified Registered"

## 2018-08-30 ENCOUNTER — Encounter
Admission: RE | Disposition: A | Discharge status: Home / Self Care | Payer: Self-pay | Source: Ambulatory Visit | Attending: Orthopedic Surgery

## 2018-08-30 ENCOUNTER — Encounter: Payer: Self-pay | Admitting: *Deleted

## 2018-08-30 ENCOUNTER — Observation Stay
Admission: RE | Admit: 2018-08-30 | Discharge: 2018-08-31 | Disposition: A | Discharge status: Home / Self Care | Payer: Managed Care, Other (non HMO) | Source: Ambulatory Visit | Attending: Orthopedic Surgery | Admitting: Orthopedic Surgery

## 2018-08-30 DIAGNOSIS — M7521 Bicipital tendinitis, right shoulder: Secondary | ICD-10-CM | POA: Insufficient documentation

## 2018-08-30 DIAGNOSIS — Z8249 Family history of ischemic heart disease and other diseases of the circulatory system: Secondary | ICD-10-CM | POA: Insufficient documentation

## 2018-08-30 DIAGNOSIS — J45909 Unspecified asthma, uncomplicated: Secondary | ICD-10-CM | POA: Insufficient documentation

## 2018-08-30 DIAGNOSIS — M75100 Unspecified rotator cuff tear or rupture of unspecified shoulder, not specified as traumatic: Secondary | ICD-10-CM | POA: Diagnosis present

## 2018-08-30 DIAGNOSIS — Z881 Allergy status to other antibiotic agents status: Secondary | ICD-10-CM | POA: Diagnosis not present

## 2018-08-30 DIAGNOSIS — F419 Anxiety disorder, unspecified: Secondary | ICD-10-CM | POA: Insufficient documentation

## 2018-08-30 DIAGNOSIS — Z882 Allergy status to sulfonamides status: Secondary | ICD-10-CM | POA: Insufficient documentation

## 2018-08-30 DIAGNOSIS — Z87891 Personal history of nicotine dependence: Secondary | ICD-10-CM | POA: Insufficient documentation

## 2018-08-30 DIAGNOSIS — Z79890 Hormone replacement therapy (postmenopausal): Secondary | ICD-10-CM | POA: Insufficient documentation

## 2018-08-30 DIAGNOSIS — M1990 Unspecified osteoarthritis, unspecified site: Secondary | ICD-10-CM | POA: Insufficient documentation

## 2018-08-30 DIAGNOSIS — E785 Hyperlipidemia, unspecified: Secondary | ICD-10-CM | POA: Insufficient documentation

## 2018-08-30 DIAGNOSIS — M19011 Primary osteoarthritis, right shoulder: Secondary | ICD-10-CM | POA: Insufficient documentation

## 2018-08-30 DIAGNOSIS — Z7951 Long term (current) use of inhaled steroids: Secondary | ICD-10-CM | POA: Diagnosis not present

## 2018-08-30 DIAGNOSIS — Z79899 Other long term (current) drug therapy: Secondary | ICD-10-CM | POA: Insufficient documentation

## 2018-08-30 DIAGNOSIS — M75121 Complete rotator cuff tear or rupture of right shoulder, not specified as traumatic: Secondary | ICD-10-CM | POA: Diagnosis not present

## 2018-08-30 DIAGNOSIS — M7541 Impingement syndrome of right shoulder: Secondary | ICD-10-CM | POA: Diagnosis present

## 2018-08-30 DIAGNOSIS — I10 Essential (primary) hypertension: Secondary | ICD-10-CM | POA: Diagnosis not present

## 2018-08-30 HISTORY — PX: SHOULDER ARTHROSCOPY WITH OPEN ROTATOR CUFF REPAIR: SHX6092

## 2018-08-30 SURGERY — ARTHROSCOPY, SHOULDER WITH REPAIR, ROTATOR CUFF, OPEN
Anesthesia: Regional | Site: Shoulder | Laterality: Right

## 2018-08-30 MED ORDER — ACETAMINOPHEN 500 MG PO TABS
1000.0000 mg | ORAL_TABLET | Freq: Three times a day (TID) | ORAL | Status: DC
  Administered 2018-08-30 – 2018-08-31 (×2): 1000 mg via ORAL
  Filled 2018-08-30 (×2): qty 2

## 2018-08-30 MED ORDER — SENNOSIDES-DOCUSATE SODIUM 8.6-50 MG PO TABS: 1.0000 | ORAL_TABLET | Freq: Every evening | ORAL | Status: DC | PRN

## 2018-08-30 MED ORDER — EPHEDRINE SULFATE 50 MG/ML IJ SOLN
INTRAMUSCULAR | Status: AC
  Filled 2018-08-30: qty 1

## 2018-08-30 MED ORDER — GLYCOPYRROLATE 0.2 MG/ML IJ SOLN
INTRAMUSCULAR | Status: AC
  Filled 2018-08-30: qty 1

## 2018-08-30 MED ORDER — OXYCODONE HCL 5 MG PO TABS: 10.0000 mg | ORAL_TABLET | ORAL | Status: DC | PRN

## 2018-08-30 MED ORDER — SUCCINYLCHOLINE CHLORIDE 20 MG/ML IJ SOLN
INTRAMUSCULAR | Status: AC
  Filled 2018-08-30: qty 1

## 2018-08-30 MED ORDER — CITALOPRAM HYDROBROMIDE 20 MG PO TABS
10.0000 mg | ORAL_TABLET | Freq: Every day | ORAL | Status: DC
  Administered 2018-08-30: 10 mg via ORAL
  Filled 2018-08-30: qty 1

## 2018-08-30 MED ORDER — OXYCODONE HCL 5 MG PO TABS
5.0000 mg | ORAL_TABLET | Freq: Four times a day (QID) | ORAL | Status: DC | PRN
  Administered 2018-08-30: 5 mg via ORAL

## 2018-08-30 MED ORDER — ASPIRIN EC 325 MG PO TBEC
325.0000 mg | DELAYED_RELEASE_TABLET | Freq: Every day | ORAL | Status: DC
  Administered 2018-08-31: 325 mg via ORAL
  Filled 2018-08-30: qty 1

## 2018-08-30 MED ORDER — OXYCODONE HCL 5 MG PO TABS: 5.0000 mg | ORAL_TABLET | ORAL | 0 refills | Status: AC | PRN

## 2018-08-30 MED ORDER — OXYCODONE HCL 5 MG PO TABS
5.0000 mg | ORAL_TABLET | Freq: Once | ORAL | Status: AC | PRN
  Administered 2018-08-30: 5 mg via ORAL

## 2018-08-30 MED ORDER — BUPIVACAINE LIPOSOME 1.3 % IJ SUSP
INTRAMUSCULAR | Status: DC | PRN
  Administered 2018-08-30: 20 mL via PERINEURAL

## 2018-08-30 MED ORDER — OXYCODONE HCL 5 MG PO TABS
ORAL_TABLET | ORAL | Status: AC
  Filled 2018-08-30: qty 1

## 2018-08-30 MED ORDER — METHOCARBAMOL 1000 MG/10ML IJ SOLN
500.0000 mg | Freq: Four times a day (QID) | INTRAVENOUS | Status: DC | PRN
  Filled 2018-08-30: qty 5

## 2018-08-30 MED ORDER — ROCURONIUM BROMIDE 100 MG/10ML IV SOLN
INTRAVENOUS | Status: DC | PRN
  Administered 2018-08-30: 45 mg via INTRAVENOUS
  Administered 2018-08-30: 5 mg via INTRAVENOUS

## 2018-08-30 MED ORDER — METOCLOPRAMIDE HCL 10 MG PO TABS: 5.0000 mg | ORAL_TABLET | Freq: Three times a day (TID) | ORAL | Status: DC | PRN

## 2018-08-30 MED ORDER — BUPIVACAINE HCL (PF) 0.5 % IJ SOLN
INTRAMUSCULAR | Status: AC
  Filled 2018-08-30: qty 10

## 2018-08-30 MED ORDER — PHENOL 1.4 % MT LIQD
1.0000 | OROMUCOSAL | Status: DC | PRN
  Filled 2018-08-30: qty 177

## 2018-08-30 MED ORDER — ASPIRIN EC 325 MG PO TBEC: 325.0000 mg | DELAYED_RELEASE_TABLET | Freq: Every day | ORAL | 0 refills | Status: AC

## 2018-08-30 MED ORDER — FENTANYL CITRATE (PF) 100 MCG/2ML IJ SOLN
INTRAMUSCULAR | Status: AC
  Administered 2018-08-30: 50 ug via INTRAVENOUS
  Filled 2018-08-30: qty 2

## 2018-08-30 MED ORDER — ONDANSETRON HCL 4 MG PO TABS: 4.0000 mg | ORAL_TABLET | Freq: Four times a day (QID) | ORAL | Status: DC | PRN

## 2018-08-30 MED ORDER — BUPIVACAINE HCL (PF) 0.5 % IJ SOLN
INTRAMUSCULAR | Status: DC | PRN
  Administered 2018-08-30: 10 mL via PERINEURAL

## 2018-08-30 MED ORDER — BIOTIN 10000 MCG PO TABS: 10000.0000 ug | ORAL_TABLET | Freq: Every day | ORAL | Status: DC

## 2018-08-30 MED ORDER — CEFAZOLIN SODIUM-DEXTROSE 2-4 GM/100ML-% IV SOLN
INTRAVENOUS | Status: AC
  Filled 2018-08-30: qty 100

## 2018-08-30 MED ORDER — METOPROLOL SUCCINATE ER 25 MG PO TB24
25.0000 mg | ORAL_TABLET | Freq: Every day | ORAL | Status: DC
  Administered 2018-08-30: 25 mg via ORAL
  Filled 2018-08-30: qty 1

## 2018-08-30 MED ORDER — POLYVINYL ALCOHOL 1.4 % OP SOLN
1.0000 [drp] | Freq: Four times a day (QID) | OPHTHALMIC | Status: DC | PRN
  Filled 2018-08-30: qty 15

## 2018-08-30 MED ORDER — MIDAZOLAM HCL 2 MG/2ML IJ SOLN
INTRAMUSCULAR | Status: AC
  Filled 2018-08-30: qty 2

## 2018-08-30 MED ORDER — SUCCINYLCHOLINE CHLORIDE 20 MG/ML IJ SOLN
INTRAMUSCULAR | Status: DC | PRN
  Administered 2018-08-30: 100 mg via INTRAVENOUS

## 2018-08-30 MED ORDER — METHOCARBAMOL 500 MG PO TABS: 500.0000 mg | ORAL_TABLET | Freq: Four times a day (QID) | ORAL | Status: DC | PRN

## 2018-08-30 MED ORDER — CEFAZOLIN SODIUM-DEXTROSE 2-4 GM/100ML-% IV SOLN
2.0000 g | Freq: Four times a day (QID) | INTRAVENOUS | Status: AC
  Administered 2018-08-30 – 2018-08-31 (×3): 2 g via INTRAVENOUS
  Filled 2018-08-30 (×4): qty 100

## 2018-08-30 MED ORDER — MIDAZOLAM HCL 2 MG/2ML IJ SOLN
INTRAMUSCULAR | Status: AC
  Administered 2018-08-30: 1 mg via INTRAVENOUS
  Filled 2018-08-30: qty 2

## 2018-08-30 MED ORDER — FENTANYL CITRATE (PF) 100 MCG/2ML IJ SOLN
INTRAMUSCULAR | Status: AC
  Filled 2018-08-30: qty 2

## 2018-08-30 MED ORDER — OXYCODONE HCL 5 MG PO TABS
5.0000 mg | ORAL_TABLET | ORAL | Status: DC | PRN
  Administered 2018-08-31: 5 mg via ORAL
  Filled 2018-08-30: qty 1

## 2018-08-30 MED ORDER — ONDANSETRON 4 MG PO TBDP: 4.0000 mg | ORAL_TABLET | Freq: Three times a day (TID) | ORAL | 0 refills | Status: DC | PRN

## 2018-08-30 MED ORDER — MEDROXYPROGESTERONE ACETATE 2.5 MG PO TABS
2.5000 mg | ORAL_TABLET | Freq: Every day | ORAL | Status: DC
  Administered 2018-08-30: 2.5 mg via ORAL
  Filled 2018-08-30 (×2): qty 1

## 2018-08-30 MED ORDER — MIDAZOLAM HCL 2 MG/2ML IJ SOLN
INTRAMUSCULAR | Status: DC | PRN
  Administered 2018-08-30: 2 mg via INTRAVENOUS

## 2018-08-30 MED ORDER — LIDOCAINE HCL (PF) 1 % IJ SOLN
INTRAMUSCULAR | Status: AC
  Filled 2018-08-30: qty 5

## 2018-08-30 MED ORDER — PROPOFOL 10 MG/ML IV BOLUS
INTRAVENOUS | Status: AC
  Filled 2018-08-30: qty 20

## 2018-08-30 MED ORDER — HYDROMORPHONE HCL 1 MG/ML IJ SOLN: 0.5000 mg | INTRAMUSCULAR | Status: DC | PRN

## 2018-08-30 MED ORDER — BISACODYL 10 MG RE SUPP: 10.0000 mg | Freq: Every day | RECTAL | Status: DC | PRN

## 2018-08-30 MED ORDER — EPHEDRINE SULFATE 50 MG/ML IJ SOLN
INTRAMUSCULAR | Status: DC | PRN
  Administered 2018-08-30 (×2): 10 mg via INTRAVENOUS

## 2018-08-30 MED ORDER — MEPERIDINE HCL 50 MG/ML IJ SOLN: 6.2500 mg | INTRAMUSCULAR | Status: DC | PRN

## 2018-08-30 MED ORDER — WHITE PETROLATUM EX OINT
TOPICAL_OINTMENT | CUTANEOUS | Status: AC
  Filled 2018-08-30: qty 5

## 2018-08-30 MED ORDER — ONDANSETRON HCL 4 MG/2ML IJ SOLN
INTRAMUSCULAR | Status: AC
  Filled 2018-08-30: qty 2

## 2018-08-30 MED ORDER — LIDOCAINE HCL (CARDIAC) PF 100 MG/5ML IV SOSY
PREFILLED_SYRINGE | INTRAVENOUS | Status: DC | PRN
  Administered 2018-08-30: 50 mg via INTRAVENOUS

## 2018-08-30 MED ORDER — ROCURONIUM BROMIDE 50 MG/5ML IV SOLN
INTRAVENOUS | Status: AC
  Filled 2018-08-30: qty 1

## 2018-08-30 MED ORDER — DOCUSATE SODIUM 100 MG PO CAPS
100.0000 mg | ORAL_CAPSULE | Freq: Two times a day (BID) | ORAL | Status: DC
  Administered 2018-08-30 – 2018-08-31 (×2): 100 mg via ORAL
  Filled 2018-08-30 (×2): qty 1

## 2018-08-30 MED ORDER — ACETAMINOPHEN 500 MG PO TABS: 1000.0000 mg | ORAL_TABLET | Freq: Three times a day (TID) | ORAL | 2 refills | Status: AC

## 2018-08-30 MED ORDER — DEXAMETHASONE SODIUM PHOSPHATE 10 MG/ML IJ SOLN
INTRAMUSCULAR | Status: DC | PRN
  Administered 2018-08-30: 5 mg via INTRAVENOUS

## 2018-08-30 MED ORDER — DIPHENHYDRAMINE HCL 12.5 MG/5ML PO ELIX: 12.5000 mg | ORAL_SOLUTION | ORAL | Status: DC | PRN

## 2018-08-30 MED ORDER — LACTATED RINGERS IV SOLN
INTRAVENOUS | Status: DC
  Administered 2018-08-30 (×2): via INTRAVENOUS

## 2018-08-30 MED ORDER — BUPIVACAINE LIPOSOME 1.3 % IJ SUSP
INTRAMUSCULAR | Status: AC
  Filled 2018-08-30: qty 20

## 2018-08-30 MED ORDER — SODIUM CHLORIDE 0.9 % IV SOLN
INTRAVENOUS | Status: DC
  Administered 2018-08-30: 21:00:00 via INTRAVENOUS

## 2018-08-30 MED ORDER — PHENYLEPHRINE HCL 10 MG/ML IJ SOLN
INTRAMUSCULAR | Status: AC
  Filled 2018-08-30: qty 1

## 2018-08-30 MED ORDER — MIDAZOLAM HCL 2 MG/2ML IJ SOLN
1.0000 mg | Freq: Once | INTRAMUSCULAR | Status: AC
  Administered 2018-08-30: 1 mg via INTRAVENOUS

## 2018-08-30 MED ORDER — PROPOFOL 10 MG/ML IV BOLUS
INTRAVENOUS | Status: DC | PRN
  Administered 2018-08-30: 150 mg via INTRAVENOUS

## 2018-08-30 MED ORDER — AMLODIPINE BESYLATE 5 MG PO TABS
5.0000 mg | ORAL_TABLET | Freq: Every day | ORAL | Status: DC
  Administered 2018-08-30: 5 mg via ORAL
  Filled 2018-08-30: qty 1

## 2018-08-30 MED ORDER — LIDOCAINE HCL (PF) 2 % IJ SOLN
INTRAMUSCULAR | Status: AC
  Filled 2018-08-30: qty 10

## 2018-08-30 MED ORDER — LACTATED RINGERS IV SOLN
INTRAVENOUS | Status: DC | PRN
  Administered 2018-08-30: 24 mL

## 2018-08-30 MED ORDER — SODIUM CHLORIDE 0.9 % IV SOLN
INTRAVENOUS | Status: DC | PRN
  Administered 2018-08-30: 30 ug/min via INTRAVENOUS

## 2018-08-30 MED ORDER — MENTHOL 3 MG MT LOZG
1.0000 | LOZENGE | OROMUCOSAL | Status: DC | PRN
  Filled 2018-08-30: qty 9

## 2018-08-30 MED ORDER — FENTANYL CITRATE (PF) 100 MCG/2ML IJ SOLN
50.0000 ug | Freq: Once | INTRAMUSCULAR | Status: AC
  Administered 2018-08-30: 50 ug via INTRAVENOUS

## 2018-08-30 MED ORDER — FENTANYL CITRATE (PF) 100 MCG/2ML IJ SOLN: 25.0000 ug | INTRAMUSCULAR | Status: DC | PRN

## 2018-08-30 MED ORDER — FENTANYL CITRATE (PF) 100 MCG/2ML IJ SOLN
INTRAMUSCULAR | Status: DC | PRN
  Administered 2018-08-30 (×2): 50 ug via INTRAVENOUS

## 2018-08-30 MED ORDER — ESTRADIOL 1 MG PO TABS
1.0000 mg | ORAL_TABLET | Freq: Every day | ORAL | Status: DC
  Administered 2018-08-30: 1 mg via ORAL
  Filled 2018-08-30 (×2): qty 1

## 2018-08-30 MED ORDER — ROSUVASTATIN CALCIUM 10 MG PO TABS
10.0000 mg | ORAL_TABLET | Freq: Every day | ORAL | Status: DC
  Administered 2018-08-30: 10 mg via ORAL
  Filled 2018-08-30: qty 1

## 2018-08-30 MED ORDER — ONDANSETRON HCL 4 MG/2ML IJ SOLN: 4.0000 mg | Freq: Four times a day (QID) | INTRAMUSCULAR | Status: DC | PRN

## 2018-08-30 MED ORDER — ALUM & MAG HYDROXIDE-SIMETH 200-200-20 MG/5ML PO SUSP: 30.0000 mL | ORAL | Status: DC | PRN

## 2018-08-30 MED ORDER — LIDOCAINE HCL (PF) 1 % IJ SOLN
INTRAMUSCULAR | Status: DC | PRN
  Administered 2018-08-30: 3 mL

## 2018-08-30 MED ORDER — DEXAMETHASONE SODIUM PHOSPHATE 10 MG/ML IJ SOLN
INTRAMUSCULAR | Status: AC
  Filled 2018-08-30: qty 1

## 2018-08-30 MED ORDER — OXYCODONE HCL 5 MG PO TABS
ORAL_TABLET | ORAL | Status: AC
  Administered 2018-08-30: 5 mg via ORAL
  Filled 2018-08-30: qty 1

## 2018-08-30 MED ORDER — FAMOTIDINE 20 MG PO TABS
ORAL_TABLET | ORAL | Status: AC
  Administered 2018-08-30: 20 mg via ORAL
  Filled 2018-08-30: qty 1

## 2018-08-30 MED ORDER — OXYCODONE HCL 5 MG/5ML PO SOLN: 5.0000 mg | Freq: Once | ORAL | Status: AC | PRN

## 2018-08-30 MED ORDER — GLYCOPYRROLATE 0.2 MG/ML IJ SOLN
INTRAMUSCULAR | Status: DC | PRN
  Administered 2018-08-30: 0.2 mg via INTRAVENOUS

## 2018-08-30 MED ORDER — PROMETHAZINE HCL 25 MG/ML IJ SOLN: 6.2500 mg | INTRAMUSCULAR | Status: DC | PRN

## 2018-08-30 MED ORDER — LORATADINE 10 MG PO TABS: 10.0000 mg | ORAL_TABLET | Freq: Every evening | ORAL | Status: DC | PRN

## 2018-08-30 MED ORDER — METOCLOPRAMIDE HCL 5 MG/ML IJ SOLN: 5.0000 mg | Freq: Three times a day (TID) | INTRAMUSCULAR | Status: DC | PRN

## 2018-08-30 MED ORDER — FAMOTIDINE 20 MG PO TABS
20.0000 mg | ORAL_TABLET | Freq: Once | ORAL | Status: AC
  Administered 2018-08-30: 20 mg via ORAL

## 2018-08-30 MED ORDER — ONDANSETRON HCL 4 MG/2ML IJ SOLN
INTRAMUSCULAR | Status: DC | PRN
  Administered 2018-08-30: 4 mg via INTRAVENOUS

## 2018-08-30 MED ORDER — PREGABALIN 25 MG PO CAPS
25.0000 mg | ORAL_CAPSULE | Freq: Two times a day (BID) | ORAL | Status: DC
  Administered 2018-08-30 – 2018-08-31 (×2): 25 mg via ORAL
  Filled 2018-08-30 (×2): qty 1

## 2018-08-30 MED ORDER — ROCURONIUM BROMIDE 50 MG/5ML IV SOLN
INTRAVENOUS | Status: AC
  Filled 2018-08-30: qty 3

## 2018-08-30 MED ORDER — TRIAMTERENE-HCTZ 37.5-25 MG PO TABS
1.0000 | ORAL_TABLET | Freq: Every day | ORAL | Status: DC
  Administered 2018-08-30: 1 via ORAL
  Filled 2018-08-30 (×2): qty 1

## 2018-08-30 SURGICAL SUPPLY — 79 items
ADAPTER IRRIG TUBE 2 SPIKE SOL (ADAPTER) ×6 IMPLANT
ANCHOR ICONIX SPEED 2.0 TAPE (Anchor) ×6 IMPLANT
ANCHOR SUT BIO SW 4.75X19.1 (Anchor) ×6 IMPLANT
ANCHOR SUT FBRTK SUTURETAP 1.3 (Anchor) ×3 IMPLANT
BLADE OSCILLATING/SAGITTAL (BLADE)
BLADE SW THK.38XMED LNG THN (BLADE) IMPLANT
BUR BR 5.5 12 FLUTE (BURR) ×3 IMPLANT
BUR RADIUS 4.0X18.5 (BURR) ×3 IMPLANT
CANNULA 5.75X7 CRYSTAL CLEAR (CANNULA) ×3 IMPLANT
CANNULA 5.75X7CM (CANNULA) ×1
CANNULA PART THRD DISP 5.75X7 (CANNULA) ×2 IMPLANT
CANNULA PARTIAL THREAD 2X7 (CANNULA) ×3 IMPLANT
CANNULA TWIST IN 8.25X9CM (CANNULA) IMPLANT
CHLORAPREP W/TINT 26ML (MISCELLANEOUS) ×3 IMPLANT
CLOSURE WOUND 1/2 X4 (GAUZE/BANDAGES/DRESSINGS)
COOLER POLAR GLACIER W/PUMP (MISCELLANEOUS) ×3 IMPLANT
COVER LIGHT HANDLE STERIS (MISCELLANEOUS) ×3 IMPLANT
CRADLE LAMINECT ARM (MISCELLANEOUS) ×6 IMPLANT
DERMABOND ADVANCED (GAUZE/BANDAGES/DRESSINGS)
DERMABOND ADVANCED .7 DNX12 (GAUZE/BANDAGES/DRESSINGS) IMPLANT
DRAPE IMP U-DRAPE 54X76 (DRAPES) ×6 IMPLANT
DRAPE INCISE IOBAN 66X45 STRL (DRAPES) ×3 IMPLANT
DRAPE SHEET LG 3/4 BI-LAMINATE (DRAPES) ×3 IMPLANT
DRAPE STERI 35X30 U-POUCH (DRAPES) ×3 IMPLANT
DRAPE U-SHAPE 47X51 STRL (DRAPES) ×3 IMPLANT
ELECT REM PT RETURN 9FT ADLT (ELECTROSURGICAL) ×3
ELECTRODE REM PT RTRN 9FT ADLT (ELECTROSURGICAL) ×1 IMPLANT
GAUZE PETRO XEROFOAM 1X8 (MISCELLANEOUS) ×3 IMPLANT
GAUZE SPONGE 4X4 12PLY STRL (GAUZE/BANDAGES/DRESSINGS) ×3 IMPLANT
GLOVE BIOGEL PI IND STRL 8 (GLOVE) ×1 IMPLANT
GLOVE BIOGEL PI INDICATOR 8 (GLOVE) ×2
GLOVE SURG SYN 8.0 (GLOVE) ×3 IMPLANT
GOWN STRL REUS W/ TWL LRG LVL3 (GOWN DISPOSABLE) ×1 IMPLANT
GOWN STRL REUS W/TWL LRG LVL3 (GOWN DISPOSABLE) ×2
GOWN STRL REUS W/TWL LRG LVL4 (GOWN DISPOSABLE) ×3 IMPLANT
IV LACTATED RINGER IRRG 3000ML (IV SOLUTION) ×48
IV LR IRRIG 3000ML ARTHROMATIC (IV SOLUTION) ×24 IMPLANT
KIT STABILIZATION SHOULDER (MISCELLANEOUS) ×3 IMPLANT
KIT STR SPEAR 1.8 FBRTK DISP (KITS) ×3 IMPLANT
KIT TURNOVER KIT A (KITS) ×3 IMPLANT
MANIFOLD NEPTUNE II (INSTRUMENTS) ×3 IMPLANT
MASK FACE SPIDER DISP (MASK) ×3 IMPLANT
MAT ABSORB  FLUID 56X50 GRAY (MISCELLANEOUS) ×4
MAT ABSORB FLUID 56X50 GRAY (MISCELLANEOUS) ×2 IMPLANT
NDL SAFETY ECLIPSE 18X1.5 (NEEDLE) ×1 IMPLANT
NEEDLE HYPO 18GX1.5 SHARP (NEEDLE) ×2
NEEDLE HYPO 22GX1.5 SAFETY (NEEDLE) ×3 IMPLANT
NEEDLE MAYO 6 CRC TAPER PT (NEEDLE) IMPLANT
NEEDLE SCORPION MULTI FIRE (NEEDLE) ×3 IMPLANT
PACK ARTHROSCOPY SHOULDER (MISCELLANEOUS) ×3 IMPLANT
PAD ABD DERMACEA PRESS 5X9 (GAUZE/BANDAGES/DRESSINGS) ×3 IMPLANT
PAD WRAPON POLAR SHDR XLG (MISCELLANEOUS) ×1 IMPLANT
SET TUBE SUCT SHAVER OUTFL 24K (TUBING) ×3 IMPLANT
SET TUBE TIP INTRA-ARTICULAR (MISCELLANEOUS) ×3 IMPLANT
SLING ULTRA II M (MISCELLANEOUS) ×3 IMPLANT
STAPLER SKIN PROX 35W (STAPLE) IMPLANT
STRAP SAFETY 5IN WIDE (MISCELLANEOUS) ×3 IMPLANT
STRIP CLOSURE SKIN 1/2X4 (GAUZE/BANDAGES/DRESSINGS) IMPLANT
SUT ETHILON 3-0 (SUTURE) IMPLANT
SUT ETHILON 4-0 (SUTURE) ×2
SUT ETHILON 4-0 FS2 18XMFL BLK (SUTURE) ×1
SUT LASSO 90 DEG SD STR (SUTURE) IMPLANT
SUT MNCRL 4-0 (SUTURE)
SUT MNCRL 4-0 27XMFL (SUTURE)
SUT PROLENE 0 CT 2 (SUTURE) IMPLANT
SUT TICRON 2-0 30IN 311381 (SUTURE) IMPLANT
SUT VIC AB 0 CT1 36 (SUTURE) IMPLANT
SUT VIC AB 2-0 CT2 27 (SUTURE) IMPLANT
SUT VICRYL 3-0 27IN (SUTURE) IMPLANT
SUTURE ETHLN 4-0 FS2 18XMF BLK (SUTURE) ×1 IMPLANT
SUTURE MNCRL 4-0 27XMF (SUTURE) IMPLANT
SYR 10ML LL (SYRINGE) ×3 IMPLANT
TAPE CLOTH 3X10 WHT NS LF (GAUZE/BANDAGES/DRESSINGS) ×3 IMPLANT
TAPE MICROFOAM 4IN (TAPE) ×3 IMPLANT
TUBING ARTHRO INFLOW-ONLY STRL (TUBING) ×3 IMPLANT
TUBING CONNECTING 10 (TUBING) ×2 IMPLANT
TUBING CONNECTING 10' (TUBING) ×1
WAND HAND CNTRL MULTIVAC 90 (MISCELLANEOUS) ×3 IMPLANT
WRAPON POLAR PAD SHDR XLG (MISCELLANEOUS) ×3

## 2018-08-30 NOTE — Anesthesia Post-op Follow-up Note (Signed)
Anesthesia QCDR form completed.        

## 2018-08-30 NOTE — Progress Notes (Signed)
As per Dr Priscella Mann, 600 ml of LR fluid administered to patient. Patient was able to ambulate to and from the rest room with assistance and void. Patient still rates pain at a 10 of 10 on her left flank.

## 2018-08-30 NOTE — H&P (Signed)
Paper H&P to be scanned into permanent record. H&P reviewed. No significant changes noted.  

## 2018-08-30 NOTE — Anesthesia Procedure Notes (Signed)
Anesthesia Regional Block: Interscalene brachial plexus block   Pre-Anesthetic Checklist: ,, timeout performed, Correct Patient, Correct Site, Correct Laterality, Correct Procedure, Correct Position, site marked, Risks and benefits discussed,  Surgical consent,  Pre-op evaluation,  At surgeon's request and post-op pain management  Laterality: Right  Prep: chloraprep       Needles:  Injection technique: Single-shot  Needle Type: Stimiplex     Needle Length: 10cm  Needle Gauge: 21     Additional Needles:   Procedures:,,,, ultrasound used (permanent image in chart),,,,  Narrative:  Start time: 08/30/2018 7:25 AM End time: 08/30/2018 7:30 AM Injection made incrementally with aspirations every 5 mL.  Performed by: Personally  Anesthesiologist: Alver Fisher, MD  Additional Notes: Functioning IV was confirmed and monitors were applied.  A Stimuplex needle was used. Sterile prep and drape,hand hygiene and sterile gloves were used.  Negative aspiration and negative test dose prior to incremental administration of local anesthetic. The patient tolerated the procedure well.

## 2018-08-30 NOTE — Anesthesia Preprocedure Evaluation (Signed)
Anesthesia Evaluation  Patient identified by MRN, date of birth, ID band Patient awake    Reviewed: Allergy & Precautions, NPO status , Patient's Chart, lab work & pertinent test results  History of Anesthesia Complications Negative for: history of anesthetic complications  Airway Mallampati: II  TM Distance: >3 FB Neck ROM: Full    Dental no notable dental hx.    Pulmonary asthma (mild intermittent, predominately uses inhaler when has URI) , former smoker,    breath sounds clear to auscultation- rhonchi (-) wheezing      Cardiovascular Exercise Tolerance: Good hypertension, Pt. on medications (-) CAD, (-) Past MI, (-) Cardiac Stents and (-) CABG  Rhythm:Regular Rate:Normal - Systolic murmurs and - Diastolic murmurs    Neuro/Psych Anxiety    GI/Hepatic Neg liver ROS, GERD  ,  Endo/Other  negative endocrine ROSneg diabetes  Renal/GU negative Renal ROS     Musculoskeletal  (+) Arthritis ,   Abdominal (+) + obese,   Peds  Hematology negative hematology ROS (+)   Anesthesia Other Findings Past Medical History: No date: Anxiety No date: Arthritis No date: Asthma No date: GERD (gastroesophageal reflux disease) No date: Hypertension   Reproductive/Obstetrics                             Anesthesia Physical Anesthesia Plan  ASA: II  Anesthesia Plan: General   Post-op Pain Management:  Regional for Post-op pain   Induction: Intravenous  PONV Risk Score and Plan: 2 and Ondansetron, Dexamethasone and Midazolam  Airway Management Planned: Oral ETT  Additional Equipment:   Intra-op Plan:   Post-operative Plan: Extubation in OR  Informed Consent: I have reviewed the patients History and Physical, chart, labs and discussed the procedure including the risks, benefits and alternatives for the proposed anesthesia with the patient or authorized representative who has indicated his/her  understanding and acceptance.   Dental advisory given  Plan Discussed with: CRNA and Anesthesiologist  Anesthesia Plan Comments:         Anesthesia Quick Evaluation

## 2018-08-30 NOTE — Progress Notes (Signed)
Removed foley cath with 50cc of yellow urine

## 2018-08-30 NOTE — Progress Notes (Signed)
PHARMACIST - PHYSICIAN ORDER COMMUNICATION  CONCERNING: P&T Medication Policy on Herbal Medications  DESCRIPTION:  This patient's order for:  Biotin TABS 10,000 mcg has been noted.  This product(s) is classified as an "herbal" or natural product. Due to a lack of definitive safety studies or FDA approval, nonstandard manufacturing practices, plus the potential risk of unknown drug-drug interactions while on inpatient medications, the Pharmacy and Therapeutics Committee does not permit the use of "herbal" or natural products of this type within [facility].   ACTION TAKEN: The pharmacy department is unable to verify this order.   Please reevaluate patient's clinical condition at discharge and address if the herbal or natural product(s) should be resumed at that time.  Gardner Candle, PharmD, BCPS Clinical Pharmacist 08/30/2018 8:16 PM

## 2018-08-30 NOTE — Anesthesia Procedure Notes (Signed)
Procedure Name: Intubation Performed by: Katena Petitjean, CRNA Pre-anesthesia Checklist: Patient identified, Patient being monitored, Timeout performed, Emergency Drugs available and Suction available Patient Re-evaluated:Patient Re-evaluated prior to induction Oxygen Delivery Method: Circle system utilized Preoxygenation: Pre-oxygenation with 100% oxygen Induction Type: IV induction Ventilation: Mask ventilation without difficulty Laryngoscope Size: Mac and 3 Grade View: Grade I Tube type: Oral Tube size: 7.0 mm Number of attempts: 1 Airway Equipment and Method: Stylet Placement Confirmation: ETT inserted through vocal cords under direct vision,  positive ETCO2 and breath sounds checked- equal and bilateral Secured at: 21 cm Tube secured with: Tape Dental Injury: Teeth and Oropharynx as per pre-operative assessment        

## 2018-08-30 NOTE — Transfer of Care (Signed)
Immediate Anesthesia Transfer of Care Note  Patient: Zoe Fields  Procedure(s) Performed: SHOULDER ARTHROSCOPY WITH ROTATOR CUFF REPAIR, DISTAL CLAVICLE EXCISION, SUBACROMIAL DECOMPRESSION, SUBPECTORAL BICEPS TENODESIS (Right Shoulder)  Patient Location: PACU  Anesthesia Type:General  Level of Consciousness: sedated  Airway & Oxygen Therapy: Patient Spontanous Breathing and Patient connected to face mask oxygen  Post-op Assessment: Report given to RN and Post -op Vital signs reviewed and stable  Post vital signs: Reviewed  Last Vitals:  Vitals Value Taken Time  BP 118/76 08/30/2018 11:27 AM  Temp    Pulse 63 08/30/2018 11:27 AM  Resp 15 08/30/2018 11:27 AM  SpO2 100 % 08/30/2018 11:27 AM    Last Pain:  Vitals:   08/30/18 0624  TempSrc: Tympanic  PainSc: 4          Complications: No apparent anesthesia complications
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
## 2018-08-30 NOTE — Discharge Instructions (Addendum)
Post-Op Instructions - Rotator Cuff Repair ° °1. Bracing: You will wear a shoulder immobilizer or sling for 6 weeks.  ° °2. Driving: No driving for 3 weeks post-op. When driving, do not wear the immobilizer. Ideally, we recommend no driving for 6 weeks while sling is in place as one arm will be immobilized.  ° °3. Activity: No active lifting for 2 months. Wrist, hand, and elbow motion only. Avoid lifting the upper arm away from the body except for hygiene. You are permitted to bend and straighten the elbow passively only (no active elbow motion). You may use your hand and wrist for typing, writing, and managing utensils (cutting food). Do not lift more than a coffee cup for 8 weeks.  When sleeping or resting, inclined positions (recliner chair or wedge pillow) and a pillow under the forearm for support may provide better comfort for up to 4 weeks.  Avoid long distance travel for 4 weeks. ° °Return to normal activities after rotator cuff repair repair normally takes 6 months on average. If rehab goes very well, may be able to do most activities at 4 months, except overhead or contact sports. ° °4. Physical Therapy: Begins 3-4 days after surgery, and proceed 1 time per week for the first 6 weeks, then 1-2 times per week from weeks 6-20 post-op. ° °5. Medications:  °- You will be provided a prescription for narcotic pain medicine. After surgery, take 1-2 narcotic tablets every 4 hours if needed for severe pain.  °- A prescription for anti-nausea medication will be provided in case the narcotic medicine causes nausea - take 1 tablet every 6 hours only if nauseated.   °- Take tylenol 1000 mg (2 Extra Strength tablets or 3 regular strength) every 8 hours for pain.  May decrease or stop tylenol 5 days after surgery if you are having minimal pain. °- Take ASA 325mg/day x 2 weeks to help prevent DVTs/PEs (blood clots).  °- DO NOT take ANY nonsteroidal anti-inflammatory pain medications (Advil, Motrin, Ibuprofen, Aleve,  Naproxen, or Naprosyn). These medicines can inhibit healing of your shoulder repair.  ° ° °If you are taking prescription medication for anxiety, depression, insomnia, muscle spasm, chronic pain, or for attention deficit disorder, you are advised that you are at a higher risk of adverse effects with use of narcotics post-op, including narcotic addiction/dependence, depressed breathing, death. °If you use non-prescribed substances: alcohol, marijuana, cocaine, heroin, methamphetamines, etc., you are at a higher risk of adverse effects with use of narcotics post-op, including narcotic addiction/dependence, depressed breathing, death. °You are advised that taking > 50 morphine milligram equivalents (MME) of narcotic pain medication per day results in twice the risk of overdose or death. For your prescription provided: oxycodone 5 mg - taking more than 6 tablets per day would result in > 50 morphine milligram equivalents (MME) of narcotic pain medication. °Be advised that we will prescribe narcotics short-term, for acute post-operative pain only - 3 weeks for major operations such as shoulder repair/reconstruction surgeries.  ° ° ° °6. Post-Op Appointment: ° °Your first post-op appointment will be 10-14 days post-op. ° °7. Work or School: For most, but not all procedures, we advise staying out of work or school for at least 1 to 2 weeks in order to recover from the stress of surgery and to allow time for healing.  ° °If you need a work or school note this can be provided.  ° °8. Smoking: If you are a smoker, you need to refrain from   smoking in the postoperative period. The nicotine in cigarettes will inhibit healing of your shoulder repair and decrease the chance of successful repair. Similarly, nicotine containing products (gum, patches) should be avoided.  ° °Post-operative Brace: °Apply and remove the brace you received as you were instructed to at the time of fitting and as described in detail as the brace’s  instructions for use indicate.  Wear the brace for the period of time prescribed by your physician.  The brace can be cleaned with soap and water and allowed to air dry only.  Should the brace result in increased pain, decreased feeling (numbness/tingling), increased swelling or an overall worsening of your medical condition, please contact your doctor immediately.  If an emergency situation occurs as a result of wearing the brace after normal business hours, please dial 911 and seek immediate medical attention.  Let your doctor know if you have any further questions about the brace issued to you. °Refer to the shoulder sling instructions for use if you have any questions regarding the correct fit of your shoulder sling.  °[facility] Customer Care for Troubleshooting: [phone] ° °Video that illustrates how to properly use a shoulder sling: °"Instructions for Proper Use of an Orthopaedic Sling" °https://www.youtube.com/watch?v=AHZpn_Xo45w ° ° °AMBULATORY SURGERY  °DISCHARGE INSTRUCTIONS ° ° °1) The drugs that you were given will stay in your system until tomorrow so for the next 24 hours you should not: ° °A) Drive an automobile °B) Make any legal decisions °C) Drink any alcoholic beverage ° ° °2) You may resume regular meals tomorrow.  Today it is better to start with liquids and gradually work up to solid foods. ° °You may eat anything you prefer, but it is better to start with liquids, then soup and crackers, and gradually work up to solid foods. ° ° °3) Please notify your doctor immediately if you have any unusual bleeding, trouble breathing, redness and pain at the surgery site, drainage, fever, or pain not relieved by medication. ° ° ° °4) Additional Instructions: ° ° ° ° ° ° ° °Please contact your physician with any problems or Same Day Surgery at [phone], Monday through Friday 6 am to 4 pm, or [facility] [place] Main number at [phone]. ° ° °

## 2018-08-30 NOTE — Progress Notes (Signed)
Patient evaluated in recovery area. States she is having LLQ abdominal pain. This has improved since immediately post-op, but is still present. She feels like she is unable to breathe deeply due to this pain and would like to be admitted due to this pain. Her operative shoulder is feeling well as block is still active.  Gen: NAD, able to converse appropriately BP 124/79   Pulse 74   Temp (!) 96.6 F (35.9 C) (Temporal)   Resp 16   SpO2 99%  LUE, RLE, LLE: distally grossly NVI RUE: significant numbness and lack of active motion due to preoperative nerve block Abdomen: soft, non-tender to palpation; no rebound or guarding, no bruising about flank or abdomen  A/P: 62 yo F POD#0 s/p R arthroscopic rotator cuff repair, biceps tenodesis, distal clavicle excision, and subacromial decompression - Plan to admit for observation, DC home tomorrow - Pain control: 1000mg  acetaminophen q8h + oxycodone 5-10 mg q3h prn  - Ancef 2g IV x 24 hours - ASA 325 mg for DVT ppx on POD#1 - OT in AM for assistance with ADLs - Patient has outpatient PT set up already

## 2018-08-31 ENCOUNTER — Encounter: Payer: Self-pay | Admitting: Orthopedic Surgery

## 2018-08-31 DIAGNOSIS — M75121 Complete rotator cuff tear or rupture of right shoulder, not specified as traumatic: Secondary | ICD-10-CM | POA: Diagnosis not present

## 2018-08-31 MED ORDER — OXYCODONE HCL 5 MG PO TABS: 5.0000 mg | ORAL_TABLET | ORAL | 0 refills | Status: DC | PRN

## 2018-08-31 MED ORDER — ONDANSETRON HCL 4 MG PO TABS: 4.0000 mg | ORAL_TABLET | Freq: Four times a day (QID) | ORAL | 0 refills | Status: DC | PRN

## 2018-08-31 NOTE — Progress Notes (Signed)
  Subjective: 1 Day Post-Op Procedure(s) (LRB): SHOULDER ARTHROSCOPY WITH ROTATOR CUFF REPAIR, DISTAL CLAVICLE EXCISION, SUBACROMIAL DECOMPRESSION, SUBPECTORAL BICEPS TENODESIS (Right) Patient reports pain as mild.   Patient seen in rounds with Dr. Allena Katz. Patient is well, and has had no acute complaints or problems Plan is to go Home after hospital stay. Negative for chest pain and shortness of breath Fever: no Gastrointestinal: Negative for nausea and vomiting  Objective: Vital signs in last 24 hours: Temp:  [96.6 F (35.9 C)-98.2 F (36.8 C)] 98 F (36.7 C) (09/30 2308) Pulse Rate:  [51-74] 51 (09/30 2308) Resp:  [14-20] 19 (09/30 2308) BP: (106-140)/(63-94) 117/74 (09/30 2308) SpO2:  [94 %-100 %] 100 % (09/30 2308)  Intake/Output from previous day:  Intake/Output Summary (Last 24 hours) at 08/31/2018 0627 Last data filed at 08/31/2018 0500 Gross per 24 hour  Intake 2849.02 ml  Output 560 ml  Net 2289.02 ml    Intake/Output this shift: Total I/O In: 749 [I.V.:549; IV Piggyback:200] Out: -   Labs: No results for input(s): HGB in the last 72 hours. No results for input(s): WBC, RBC, HCT, PLT in the last 72 hours. No results for input(s): NA, K, CL, CO2, BUN, CREATININE, GLUCOSE, CALCIUM in the last 72 hours. No results for input(s): LABPT, INR in the last 72 hours.   EXAM General - Patient is Alert and Oriented Extremity - Neurovascular intact Sensation intact distally Compartment soft Dressing/Incision - clean, dry, no drainage Motor Function - intact, moving fingers well on exam.    Past Medical History:  Diagnosis Date  . Anxiety   . Arthritis   . Asthma   . GERD (gastroesophageal reflux disease)   . Hypertension     Assessment/Plan: 1 Day Post-Op Procedure(s) (LRB): SHOULDER ARTHROSCOPY WITH ROTATOR CUFF REPAIR, DISTAL CLAVICLE EXCISION, SUBACROMIAL DECOMPRESSION, SUBPECTORAL BICEPS TENODESIS (Right) Active Problems:   Rotator cuff tear  Estimated  body mass index is 30.36 kg/m as calculated from the following:   Height as of 08/23/18: 5\' 1"  (1.549 m).   Weight as of 08/23/18: 72.9 kg. Advance diet Up with therapy D/C IV fluids  Discharge home after physical therapy. Physical therapy already set up at home. Stay in the shoulder immobilizer  DVT Prophylaxis - Aspirin, Foot Pumps and TED hose   Dedra Skeens, PA-C Orthopaedic Surgery 08/31/2018, 6:27 AM

## 2018-08-31 NOTE — Evaluation (Signed)
Occupational Therapy Evaluation Patient Details Name: Zoe Fields MRN: [id number] DOB: 12/17/1955 Today's Date: 08/31/2018    History of Present Illness 62 yo F POD#1 s/p R arthroscopic rotator cuff repair, biceps tenodesis, distal clavicle excision, and subacromial decompression. PMHx includes anxiety, arthritis, asthma, GERD, and HTN.    Clinical Impression   Patient was seen for an OT evaluation this date. Pt lives alone but plans to stay with her mother who lives next door in a 1 story home with 2+1 steps and a L rail. Pt will have assist from her mother and other family. Prior to surgery, pt was independent. Pt has orders for RUE to be immobilized and will be NWBing per MD. Patient presents with impaired strength/ROM, pain, and sensation to RUE with block not completely resolved yet. These impairments result in a decreased ability to perform self care tasks requiring min assist for UB dressing and bathing and max assist for application of polar care, compression stockings, and sling/immobilizer. Pt instructed in polar care mgt, compression stockings mgt, sling/immobilizer mgt, ROM exercises for RUE, RUE precautions, adaptive strategies for bathing/dressing/toileting/grooming, positioning and considerations for sleep, falls prevention including pet care considerations, and home/routines modifications to maximize falls prevention, safety, and independence. Handout provided. OT adjusted sling/immobilizer and polar care to improve comfort, optimize positioning, and to maximize skin integrity/safety. Pt verbalized understanding of all education/training provided. Pt will benefit from skilled OT services while in the hospital to address these limitations and improve independence in daily tasks. Recommend follow up therapy as arranged by the surgeon for rehab of shoulder following discharge.     Follow Up Recommendations  No OT follow up;Follow surgeon's recommendation for DC plan and follow-up  therapies    Equipment Recommendations  None recommended by OT    Recommendations for Other Services       Precautions / Restrictions Precautions Precautions: Fall;Shoulder Type of Shoulder Precautions: NO ROM to shoulder, NO AROM to elbow/wrist, AROM hand okay Shoulder Interventions: Shoulder sling/immobilizer;Shoulder abduction pillow;At all times;Off for dressing/bathing/exercises Precaution Booklet Issued: Yes (comment) Required Braces or Orthoses: Sling(shoulder sling/immobilizer) Restrictions Weight Bearing Restrictions: Yes RUE Weight Bearing: Non weight bearing      Mobility Bed Mobility Overal bed mobility: Modified Independent             General bed mobility comments: instructed to get out on L side to improve safety/performance, pt able to perform without difficulty, instructed in positioning of RUE during sleep  Transfers Overall transfer level: Modified independent Equipment used: None             General transfer comment: pt able to perform with additional time, no physical assist needed, no LOB noted    Balance Overall balance assessment: No apparent balance deficits (not formally assessed)                                         ADL either performed or assessed with clinical judgement   ADL Overall ADL's : Needs assistance/impaired Eating/Feeding: Modified independent;Sitting   Grooming: Sitting;Modified independent Grooming Details (indicate cue type and reason): pt instructed in modified technique for R underarm grooming Upper Body Bathing: Minimal assistance;Sitting;With caregiver independent assisting Upper Body Bathing Details (indicate cue type and reason): pt instructed in modified technique for R underarm bathing Lower Body Bathing: Sit to/from stand;Modified independent   Upper Body Dressing : Sitting;Minimal assistance;With caregiver independent assisting Upper Body  Dressing Details (indicate cue type and reason):  pt instructed in modified hemi techniques for UB dressing  Lower Body Dressing: Sit to/from stand   Toilet Transfer: Modified Independent;Regular Toilet;Ambulation           Functional mobility during ADLs: Modified independent       Vision Baseline Vision/History: Wears glasses Wears Glasses: At all times Patient Visual Report: No change from baseline       Perception     Praxis      Pertinent Vitals/Pain Pain Assessment: 0-10 Pain Score: 8  Pain Location: R shoulder Pain Descriptors / Indicators: Aching Pain Intervention(s): Limited activity within patient's tolerance;Monitored during session;Premedicated before session;Repositioned;Ice applied     Hand Dominance Right   Extremity/Trunk Assessment Upper Extremity Assessment Upper Extremity Assessment: RUE deficits/detail(LUE WFL) RUE Deficits / Details: impaired sensation to RUE, tingling in hand/forearm, able to move fingers well RUE: Unable to fully assess due to pain;Unable to fully assess due to immobilization RUE Sensation: decreased light touch;decreased proprioception RUE Coordination: decreased fine motor;decreased gross motor   Lower Extremity Assessment Lower Extremity Assessment: Overall WFL for tasks assessed   Cervical / Trunk Assessment Cervical / Trunk Assessment: Normal   Communication Communication Communication: No difficulties   Cognition Arousal/Alertness: Awake/alert Behavior During Therapy: WFL for tasks assessed/performed Overall Cognitive Status: Within Functional Limits for tasks assessed                                     General Comments  polar care and sling adjusted to maximize skin protection, positioning, and comfort    Exercises Other Exercises Other Exercises: Pt instructed in shoulder protocol with handout provided, including RUE precautions, polar care mgt, sling mgt, compression stocking mgt, positioning for sleep, falls prevention, modified strategies  for UB grooming/bathing/dressing. Pt verbalized understanding to all edu/training provided.    Shoulder Instructions      Home Living Family/patient expects to be discharged to:: Private residence Living Arrangements: Alone Available Help at Discharge: Family;Available 24 hours/day(planning to stay with mother who lives next door during recovery) Type of Home: House Home Access: Stairs to enter [employer] of Steps: 2+1 Entrance Stairs-Rails: Left Home Layout: One level     Bathroom Shower/Tub: Chief Strategy Officer: Standard     Home Equipment: None          Prior Functioning/Environment Level of Independence: Independent        Comments: Indep with mobility, ADL, IADL including driving. No falls.        OT Problem List: Decreased strength;Decreased knowledge of use of DME or AE;Decreased range of motion;Impaired UE functional use;Pain;Impaired sensation      OT Treatment/Interventions: Self-care/ADL training;Therapeutic exercise;Therapeutic activities;DME and/or AE instruction;Patient/family education    OT Goals(Current goals can be found in the care plan section) Acute Rehab OT Goals Patient Stated Goal: go home and recover with family support OT Goal Formulation: With patient Time For Goal Achievement: 09/14/18 Potential to Achieve Goals: Good ADL Goals Pt Will Perform Upper Body Dressing: with caregiver independent in assisting;sitting Additional ADL Goal #1: Pt will independently instruct family in polar care mgt, including positioning, wear schedule, and donning/doffing. Additional ADL Goal #2: Pt will independently instruct family in compression stocking mgt, including positioning, wear schedule, and donning/doffing. Additional ADL Goal #3: Pt will independently instruct family in shoulder sling/immobilizer mgt, including positioning, wear schedule, and donning/doffing.  OT Frequency: Min 1X/week  Barriers to D/C:             Co-evaluation              AM-PAC PT "6 Clicks" Daily Activity     Outcome Measure Help from another person eating meals?: None Help from another person taking care of personal grooming?: None Help from another person toileting, which includes using toliet, bedpan, or urinal?: None Help from another person bathing (including washing, rinsing, drying)?: A Little Help from another person to put on and taking off regular upper body clothing?: A Little Help from another person to put on and taking off regular lower body clothing?: None 6 Click Score: 22   End of Session Equipment Utilized During Treatment: Gait belt;Other (comment)(RUE shoulder sling/immobilizer)  Activity Tolerance: Patient tolerated treatment well Patient left: in bed;with call bell/phone within reach;with bed alarm set;with SCD's reapplied;Other (comment)(sling and polar care in place)  OT Visit Diagnosis: Other abnormalities of gait and mobility (R26.89)                Time: 5366-4403 OT Time Calculation (min): 48 min Charges:  OT General Charges $OT Visit: 1 Visit OT Evaluation $OT Eval Moderate Complexity: 1 Mod OT Treatments $Self Care/Home Management : 23-37 mins  Richrd Prime, MPH, MS, OTR/L ascom [phone] 08/31/18, 10:27 AM

## 2018-08-31 NOTE — Anesthesia Postprocedure Evaluation (Signed)
Anesthesia Post Note  Patient: Zoe Fields  Procedure(s) Performed: SHOULDER ARTHROSCOPY WITH ROTATOR CUFF REPAIR, DISTAL CLAVICLE EXCISION, SUBACROMIAL DECOMPRESSION, SUBPECTORAL BICEPS TENODESIS (Right Shoulder)  Patient location during evaluation: PACU Anesthesia Type: Regional Level of consciousness: awake and alert and oriented Pain management: pain level controlled Vital Signs Assessment: post-procedure vital signs reviewed and stable Respiratory status: spontaneous breathing, nonlabored ventilation and respiratory function stable Cardiovascular status: blood pressure returned to baseline and stable Postop Assessment: no signs of nausea or vomiting Anesthetic complications: no     Last Vitals:  Vitals:   08/30/18 2224 08/30/18 2308  BP: 130/71 117/74  Pulse: (!) 51 (!) 51  Resp: 19 19  Temp: 36.4 C 36.7 C  SpO2: 100% 100%    Last Pain:  Vitals:   08/30/18 2308  TempSrc: Oral  PainSc:                  Kalany Diekmann

## 2018-08-31 NOTE — Progress Notes (Signed)
DISCHARGE NOTE:  Pt given discharge instructions. Pt states she received prescriptions yesterday in PACU. Pts dressing clean dry and intact, polar care sent with pt.  Pt wheeled to car by staff member.

## 2018-08-31 NOTE — Discharge Summary (Signed)
Physician Discharge Summary  Subjective: 1 Day Post-Op Procedure(s) (LRB): SHOULDER ARTHROSCOPY WITH ROTATOR CUFF REPAIR, DISTAL CLAVICLE EXCISION, SUBACROMIAL DECOMPRESSION, SUBPECTORAL BICEPS TENODESIS (Right) Patient reports pain as mild.   Patient seen in rounds with Dr. Allena Katz. Patient is well, and has had no acute complaints or problems Patient is ready to go home after physical therapy  Physician Discharge Summary  Patient ID: Zoe Fields MRN: [id number] DOB/AGE: 1956/10/13 62 y.o.  Admit date: 08/30/2018 Discharge date: 08/31/2018  Admission Diagnoses:  Discharge Diagnoses:  Active Problems:   Rotator cuff tear   Discharged Condition: fair  Hospital Course: The patient is postop day 1 from a right shoulder arthroscopy with rotator cuff repair.  She is doing better with her pain control.  She has minimal nausea.  She is ready to go home after physical therapy.  Treatments: surgery:  1. Right arthroscopic rotator cuff repair 2. Right open subpectoral biceps tenodesis 3. Right arthroscopic distal clavicle excision 4. Right extensive debridement of shoulder (glenohumeral and subacromial spaces) 5. Right subacromial decompression  SURGEON: Rosealee Albee, MD  ASSISTANT: Sonny Dandy, PA  ANESTHESIA: Gen with interscalene block w/Exparil  ESTIMATED BLOOD LOSS: 10cc  DRAINS:  none  TOTAL IV FLUIDS: per anesthesia     SPECIMENS: none  IMPLANTS:   - [employer]ryker Iconix SPEED 2.57mm Anchor double loaded with tape and suture - x2 - Arthrex 4.68mm SwiveLock - x2 - Arthrex - Double loaded Public relations account executive - x1  Discharge Exam: Blood pressure 117/74, pulse (!) 51, temperature 98 F (36.7 C), temperature source Oral, resp. rate 19, SpO2 100 %.   Disposition: Discharge disposition: 01-Home or Self Care       Discharge Instructions    Call MD / Call 911   Complete by:  As directed    If you experience chest pain or shortness of breath, CALL 911  and be transported to the hospital emergency room.  If you develope a fever above 101 F, pus (white drainage) or increased drainage or redness at the wound, or calf pain, call your surgeon's office.   Constipation Prevention   Complete by:  As directed    Drink plenty of fluids.  Prune juice may be helpful.  You may use a stool softener, such as Colace (over the counter) 100 mg twice a day.  Use MiraLax (over the counter) for constipation as needed.   Diet - low sodium heart healthy   Complete by:  As directed    Increase activity slowly as tolerated   Complete by:  As directed      Allergies as of 08/31/2018      Reactions   Neomycin    hives   Sulfa Antibiotics    Redness    Benazepril Other (See Comments)   Pt is unaware of reaction type   Eryc [erythromycin] Other (See Comments)   Pt is unaware of reaction type   Flexeril [cyclobenzaprine] Other (See Comments)   Hallucinate   Hyzaar [losartan Potassium-hctz] Swelling      Medication List    STOP taking these medications   methocarbamol 500 MG tablet Commonly known as:  ROBAXIN     TAKE these medications   acetaminophen 500 MG tablet Commonly known as:  TYLENOL Take 2 tablets (1,000 mg total) by mouth every 8 (eight) hours. What changed:    when to take this  reasons to take this   amLODipine 5 MG tablet Commonly known as:  NORVASC Take 5 mg by  mouth at bedtime.   aspirin EC 325 MG tablet Take 1 tablet (325 mg total) by mouth daily for 14 days.   Biotin 16109 MCG Tabs Take 10,000 mcg by mouth at bedtime.   citalopram 10 MG tablet Commonly known as:  CELEXA Take 10 mg by mouth at bedtime.   estradiol 1 MG tablet Commonly known as:  ESTRACE Take 1 mg by mouth at bedtime.   loratadine 10 MG tablet Commonly known as:  CLARITIN Take 10 mg by mouth at bedtime as needed for allergies.   LUBRICANT DROPS 1.4 % ophthalmic solution Generic drug:  polyvinyl alcohol Place 1 drop into both eyes 4 (four) times  daily as needed for dry eyes.   medroxyPROGESTERone 2.5 MG tablet Commonly known as:  PROVERA Take 2.5 mg by mouth at bedtime.   metoprolol succinate 25 MG 24 hr tablet Commonly known as:  TOPROL-XL Take 25 mg by mouth at bedtime.   ondansetron 4 MG disintegrating tablet Commonly known as:  ZOFRAN-ODT Take 1 tablet (4 mg total) by mouth every 8 (eight) hours as needed for nausea or vomiting.   ondansetron 4 MG tablet Commonly known as:  ZOFRAN Take 1 tablet (4 mg total) by mouth every 6 (six) hours as needed for nausea.   oxyCODONE 5 MG immediate release tablet Commonly known as:  Oxy IR/ROXICODONE Take 1-2 tablets (5-10 mg total) by mouth every 4 (four) hours as needed (pain). What changed:    how much to take  when to take this  reasons to take this   oxyCODONE 5 MG immediate release tablet Commonly known as:  Oxy IR/ROXICODONE Take 1-2 tablets (5-10 mg total) by mouth every 3 (three) hours as needed for moderate pain (pain score 4-6). What changed:  You were already taking a medication with the same name, and this prescription was added. Make sure you understand how and when to take each.   pregabalin 25 MG capsule Commonly known as:  LYRICA Take 25 mg by mouth 2 (two) times daily.   rosuvastatin 10 MG tablet Commonly known as:  CRESTOR Take 10 mg by mouth at bedtime.   triamcinolone cream 0.5 % Commonly known as:  KENALOG Apply 1 application topically 2 (two) times daily as needed for rash or itching.   triamterene-hydrochlorothiazide 37.5-25 MG tablet Commonly known as:  MAXZIDE-25 Take 1 tablet by mouth at bedtime.      Follow-up Information    Signa Kell, MD Follow up on 09/15/2018.   Specialty:  Orthopedic Surgery Why:  appointment @ 2:45 pm Contact information: [address] [place] [address] [phone]           Signed: Lenard Forth, Meranda Dechaine 08/31/2018, 6:32 AM   Objective: Vital signs in last 24 hours: Temp:  [96.6 F (35.9  C)-98.2 F (36.8 C)] 98 F (36.7 C) (09/30 2308) Pulse Rate:  [51-74] 51 (09/30 2308) Resp:  [14-20] 19 (09/30 2308) BP: (106-140)/(63-94) 117/74 (09/30 2308) SpO2:  [94 %-100 %] 100 % (09/30 2308)  Intake/Output from previous day:  Intake/Output Summary (Last 24 hours) at 08/31/2018 2956 Last data filed at 08/31/2018 0500 Gross per 24 hour  Intake 2849.02 ml  Output 560 ml  Net 2289.02 ml    Intake/Output this shift: Total I/O In: 749 [I.V.:549; IV Piggyback:200] Out: -   Labs: No results for input(s): HGB in the last 72 hours. No results for input(s): WBC, RBC, HCT, PLT in the last 72 hours. No results for input(s): NA, K, CL,  CO2, BUN, CREATININE, GLUCOSE, CALCIUM in the last 72 hours. No results for input(s): LABPT, INR in the last 72 hours.  EXAM: General - Patient is Alert and Oriented Extremity - Sensation intact distally Intact pulses distally Compartment soft Incision - clean, dry, no drainage, inthe shoulder immobilizer Motor Function -moving fingers well on exam.  Assessment/Plan: 1 Day Post-Op Procedure(s) (LRB): SHOULDER ARTHROSCOPY WITH ROTATOR CUFF REPAIR, DISTAL CLAVICLE EXCISION, SUBACROMIAL DECOMPRESSION, SUBPECTORAL BICEPS TENODESIS (Right) Procedure(s) (LRB): SHOULDER ARTHROSCOPY WITH ROTATOR CUFF REPAIR, DISTAL CLAVICLE EXCISION, SUBACROMIAL DECOMPRESSION, SUBPECTORAL BICEPS TENODESIS (Right) Past Medical History:  Diagnosis Date  . Anxiety   . Arthritis   . Asthma   . GERD (gastroesophageal reflux disease)   . Hypertension    Active Problems:   Rotator cuff tear  Estimated body mass index is 30.36 kg/m as calculated from the following:   Height as of 08/23/18: 5\' 1"  (1.549 m).   Weight as of 08/23/18: 72.9 kg. Advance diet Up with therapy D/C IV fluids Diet - Regular diet Follow up - in 2 weeks Activity -stay in the shoulder immobilizer. Disposition - Home Condition Upon Discharge - Stable DVT Prophylaxis - Aspirin  Dedra Skeens,  PA-C Orthopaedic Surgery 08/31/2018, 6:32 AM

## 2019-05-05 ENCOUNTER — Other Ambulatory Visit: Payer: Self-pay | Admitting: Neurosurgery

## 2019-05-05 DIAGNOSIS — G959 Disease of spinal cord, unspecified: Secondary | ICD-10-CM

## 2019-05-20 ENCOUNTER — Ambulatory Visit
Admission: RE | Admit: 2019-05-20 | Discharge: 2019-05-20 | Disposition: A | Discharge status: Home / Self Care | Payer: Managed Care, Other (non HMO) | Source: Ambulatory Visit | Attending: Neurosurgery | Admitting: Neurosurgery

## 2019-05-20 ENCOUNTER — Other Ambulatory Visit: Payer: Self-pay

## 2019-05-20 DIAGNOSIS — G959 Disease of spinal cord, unspecified: Secondary | ICD-10-CM | POA: Diagnosis present

## 2019-07-16 ENCOUNTER — Other Ambulatory Visit: Payer: Self-pay

## 2019-07-16 DIAGNOSIS — Z20822 Contact with and (suspected) exposure to covid-19: Secondary | ICD-10-CM

## 2019-07-17 LAB — NOVEL CORONAVIRUS, NAA: SARS-CoV-2, NAA: NOT DETECTED

## 2019-09-08 ENCOUNTER — Other Ambulatory Visit: Payer: Self-pay | Admitting: Physician Assistant

## 2019-09-08 DIAGNOSIS — Z1231 Encounter for screening mammogram for malignant neoplasm of breast: Secondary | ICD-10-CM

## 2019-09-08 DIAGNOSIS — G459 Transient cerebral ischemic attack, unspecified: Secondary | ICD-10-CM

## 2019-09-20 ENCOUNTER — Ambulatory Visit
Admission: RE | Admit: 2019-09-20 | Discharge: 2019-09-20 | Disposition: A | Discharge status: Home / Self Care | Payer: Managed Care, Other (non HMO) | Source: Ambulatory Visit | Attending: Physician Assistant | Admitting: Physician Assistant

## 2019-09-20 ENCOUNTER — Other Ambulatory Visit: Payer: Self-pay

## 2019-09-20 DIAGNOSIS — G459 Transient cerebral ischemic attack, unspecified: Secondary | ICD-10-CM | POA: Insufficient documentation

## 2019-09-20 MED ORDER — GADOBUTROL 1 MMOL/ML IV SOLN
7.0000 mL | Freq: Once | INTRAVENOUS | Status: AC | PRN
  Administered 2019-09-20: 7 mL via INTRAVENOUS

## 2019-09-27 ENCOUNTER — Observation Stay
Admission: EM | Admit: 2019-09-27 | Discharge: 2019-09-30 | Disposition: A | Discharge status: Home / Self Care | Payer: Managed Care, Other (non HMO) | Attending: Internal Medicine | Admitting: Internal Medicine

## 2019-09-27 ENCOUNTER — Other Ambulatory Visit: Payer: Self-pay

## 2019-09-27 ENCOUNTER — Emergency Department: Payer: Managed Care, Other (non HMO)

## 2019-09-27 ENCOUNTER — Encounter: Payer: Self-pay | Admitting: Emergency Medicine

## 2019-09-27 DIAGNOSIS — J0101 Acute recurrent maxillary sinusitis: Secondary | ICD-10-CM

## 2019-09-27 DIAGNOSIS — K219 Gastro-esophageal reflux disease without esophagitis: Secondary | ICD-10-CM | POA: Insufficient documentation

## 2019-09-27 DIAGNOSIS — I10 Essential (primary) hypertension: Secondary | ICD-10-CM | POA: Diagnosis not present

## 2019-09-27 DIAGNOSIS — Z79899 Other long term (current) drug therapy: Secondary | ICD-10-CM | POA: Diagnosis not present

## 2019-09-27 DIAGNOSIS — R2689 Other abnormalities of gait and mobility: Secondary | ICD-10-CM | POA: Diagnosis not present

## 2019-09-27 DIAGNOSIS — Z87891 Personal history of nicotine dependence: Secondary | ICD-10-CM | POA: Insufficient documentation

## 2019-09-27 DIAGNOSIS — H8110 Benign paroxysmal vertigo, unspecified ear: Principal | ICD-10-CM | POA: Insufficient documentation

## 2019-09-27 DIAGNOSIS — Z881 Allergy status to other antibiotic agents status: Secondary | ICD-10-CM | POA: Diagnosis not present

## 2019-09-27 DIAGNOSIS — J0100 Acute maxillary sinusitis, unspecified: Secondary | ICD-10-CM | POA: Insufficient documentation

## 2019-09-27 DIAGNOSIS — F419 Anxiety disorder, unspecified: Secondary | ICD-10-CM | POA: Diagnosis not present

## 2019-09-27 DIAGNOSIS — M1990 Unspecified osteoarthritis, unspecified site: Secondary | ICD-10-CM | POA: Insufficient documentation

## 2019-09-27 DIAGNOSIS — Z888 Allergy status to other drugs, medicaments and biological substances status: Secondary | ICD-10-CM | POA: Diagnosis not present

## 2019-09-27 DIAGNOSIS — R946 Abnormal results of thyroid function studies: Secondary | ICD-10-CM | POA: Diagnosis not present

## 2019-09-27 DIAGNOSIS — R001 Bradycardia, unspecified: Secondary | ICD-10-CM | POA: Insufficient documentation

## 2019-09-27 DIAGNOSIS — Z20828 Contact with and (suspected) exposure to other viral communicable diseases: Secondary | ICD-10-CM | POA: Insufficient documentation

## 2019-09-27 DIAGNOSIS — J45909 Unspecified asthma, uncomplicated: Secondary | ICD-10-CM | POA: Insufficient documentation

## 2019-09-27 DIAGNOSIS — R42 Dizziness and giddiness: Secondary | ICD-10-CM | POA: Diagnosis present

## 2019-09-27 DIAGNOSIS — Z882 Allergy status to sulfonamides status: Secondary | ICD-10-CM | POA: Diagnosis not present

## 2019-09-27 LAB — URINALYSIS, COMPLETE (UACMP) WITH MICROSCOPIC
Bilirubin Urine: NEGATIVE
Glucose, UA: NEGATIVE mg/dL
Hgb urine dipstick: NEGATIVE
Ketones, ur: NEGATIVE mg/dL
Leukocytes,Ua: NEGATIVE
Nitrite: NEGATIVE
Protein, ur: NEGATIVE mg/dL
Specific Gravity, Urine: 1.018 (ref 1.005–1.030)
pH: 5 (ref 5.0–8.0)

## 2019-09-27 LAB — BASIC METABOLIC PANEL
Anion gap: 11 (ref 5–15)
BUN: 17 mg/dL (ref 8–23)
CO2: 26 mmol/L (ref 22–32)
Calcium: 9.5 mg/dL (ref 8.9–10.3)
Chloride: 101 mmol/L (ref 98–111)
Creatinine, Ser: 1.03 mg/dL — ABNORMAL HIGH (ref 0.44–1.00)
GFR calc Af Amer: >60 mL/min (ref >60)
GFR calc non Af Amer: 58 mL/min — ABNORMAL LOW (ref >60)
Glucose, Bld: 114 mg/dL — ABNORMAL HIGH (ref 70–99)
Potassium: 3.3 mmol/L — ABNORMAL LOW (ref 3.5–5.1)
Sodium: 138 mmol/L (ref 135–145)

## 2019-09-27 LAB — CBC
HCT: 40.7 % (ref 36.0–46.0)
Hemoglobin: 13.4 g/dL (ref 12.0–15.0)
MCH: 30 pg (ref 26.0–34.0)
MCHC: 32.9 g/dL (ref 30.0–36.0)
MCV: 91.3 fL (ref 80.0–100.0)
Platelets: 323 10*3/uL (ref 150–400)
RBC: 4.46 MIL/uL (ref 3.87–5.11)
RDW: 13.9 % (ref 11.5–15.5)
WBC: 7.4 10*3/uL (ref 4.0–10.5)
nRBC: 0 % (ref 0.0–0.2)

## 2019-09-27 MED ORDER — POLYVINYL ALCOHOL 1.4 % OP SOLN
1.0000 [drp] | Freq: Four times a day (QID) | OPHTHALMIC | Status: DC | PRN
  Filled 2019-09-27: qty 15

## 2019-09-27 MED ORDER — ENOXAPARIN SODIUM 40 MG/0.4ML ~~LOC~~ SOLN
40.0000 mg | SUBCUTANEOUS | Status: DC
  Administered 2019-09-28 – 2019-09-29 (×2): 40 mg via SUBCUTANEOUS
  Filled 2019-09-27 (×3): qty 0.4

## 2019-09-27 MED ORDER — ROSUVASTATIN CALCIUM 10 MG PO TABS
10.0000 mg | ORAL_TABLET | Freq: Every day | ORAL | Status: DC
  Administered 2019-09-27 – 2019-09-29 (×3): 10 mg via ORAL
  Filled 2019-09-27 (×3): qty 1

## 2019-09-27 MED ORDER — DEXAMETHASONE SODIUM PHOSPHATE 10 MG/ML IJ SOLN
10.0000 mg | Freq: Once | INTRAMUSCULAR | Status: AC
  Administered 2019-09-27: 10 mg via INTRAVENOUS
  Filled 2019-09-27: qty 1

## 2019-09-27 MED ORDER — ONDANSETRON HCL 4 MG/2ML IJ SOLN
4.0000 mg | Freq: Once | INTRAMUSCULAR | Status: AC
  Administered 2019-09-27: 14:00:00 4 mg via INTRAVENOUS
  Filled 2019-09-27: qty 2

## 2019-09-27 MED ORDER — ACETAMINOPHEN 325 MG PO TABS
650.0000 mg | ORAL_TABLET | Freq: Four times a day (QID) | ORAL | Status: DC | PRN
  Administered 2019-09-28: 14:00:00 650 mg via ORAL
  Filled 2019-09-27: qty 2

## 2019-09-27 MED ORDER — SODIUM CHLORIDE 0.9 % IV BOLUS
1000.0000 mL | Freq: Once | INTRAVENOUS | Status: AC
  Administered 2019-09-27: 1000 mL via INTRAVENOUS

## 2019-09-27 MED ORDER — DIAZEPAM 5 MG PO TABS
5.0000 mg | ORAL_TABLET | Freq: Once | ORAL | Status: AC
  Administered 2019-09-27: 19:00:00 5 mg via ORAL
  Filled 2019-09-27: qty 1

## 2019-09-27 MED ORDER — ONDANSETRON HCL 4 MG/2ML IJ SOLN: 4.0000 mg | Freq: Four times a day (QID) | INTRAMUSCULAR | Status: DC | PRN

## 2019-09-27 MED ORDER — CITALOPRAM HYDROBROMIDE 20 MG PO TABS
10.0000 mg | ORAL_TABLET | Freq: Every day | ORAL | Status: DC
  Administered 2019-09-27 – 2019-09-29 (×3): 10 mg via ORAL
  Filled 2019-09-27 (×3): qty 1

## 2019-09-27 MED ORDER — POLYETHYLENE GLYCOL 3350 17 G PO PACK: 17.0000 g | PACK | Freq: Every day | ORAL | Status: DC | PRN

## 2019-09-27 MED ORDER — MEDROXYPROGESTERONE ACETATE 2.5 MG PO TABS
2.5000 mg | ORAL_TABLET | Freq: Every day | ORAL | Status: DC
  Administered 2019-09-27 – 2019-09-29 (×3): 2.5 mg via ORAL
  Filled 2019-09-27 (×4): qty 1

## 2019-09-27 MED ORDER — POTASSIUM CHLORIDE CRYS ER 20 MEQ PO TBCR
40.0000 meq | EXTENDED_RELEASE_TABLET | Freq: Once | ORAL | Status: AC
  Administered 2019-09-27: 40 meq via ORAL
  Filled 2019-09-27: qty 2

## 2019-09-27 MED ORDER — MECLIZINE HCL 25 MG PO TABS
12.5000 mg | ORAL_TABLET | Freq: Once | ORAL | Status: AC
  Administered 2019-09-27: 12.5 mg via ORAL
  Filled 2019-09-27: qty 1

## 2019-09-27 MED ORDER — MECLIZINE HCL 25 MG PO TABS
25.0000 mg | ORAL_TABLET | Freq: Three times a day (TID) | ORAL | Status: DC
  Administered 2019-09-27 – 2019-09-30 (×8): 25 mg via ORAL
  Filled 2019-09-27 (×11): qty 1

## 2019-09-27 MED ORDER — ALBUTEROL SULFATE (2.5 MG/3ML) 0.083% IN NEBU: 2.5000 mg | INHALATION_SOLUTION | RESPIRATORY_TRACT | Status: DC | PRN

## 2019-09-27 MED ORDER — PREGABALIN 25 MG PO CAPS
25.0000 mg | ORAL_CAPSULE | Freq: Two times a day (BID) | ORAL | Status: DC
  Administered 2019-09-28: 10:00:00 25 mg via ORAL
  Filled 2019-09-27 (×5): qty 1

## 2019-09-27 MED ORDER — ESTRADIOL 1 MG PO TABS
1.0000 mg | ORAL_TABLET | Freq: Every day | ORAL | Status: DC
  Administered 2019-09-27 – 2019-09-29 (×3): 1 mg via ORAL
  Filled 2019-09-27 (×4): qty 1

## 2019-09-27 MED ORDER — OXYCODONE HCL 5 MG PO TABS
5.0000 mg | ORAL_TABLET | Freq: Four times a day (QID) | ORAL | Status: DC | PRN
  Administered 2019-09-29 (×2): 5 mg via ORAL
  Filled 2019-09-27 (×2): qty 1

## 2019-09-27 MED ORDER — LORAZEPAM 2 MG/ML IJ SOLN
1.0000 mg | Freq: Once | INTRAMUSCULAR | Status: AC
  Administered 2019-09-27: 14:00:00 1 mg via INTRAVENOUS
  Filled 2019-09-27: qty 1

## 2019-09-27 MED ORDER — ACETAMINOPHEN 650 MG RE SUPP: 650.0000 mg | Freq: Four times a day (QID) | RECTAL | Status: DC | PRN

## 2019-09-27 MED ORDER — AMLODIPINE BESYLATE 5 MG PO TABS
5.0000 mg | ORAL_TABLET | Freq: Every day | ORAL | Status: DC
  Administered 2019-09-28 – 2019-09-29 (×2): 5 mg via ORAL
  Filled 2019-09-27 (×2): qty 1

## 2019-09-27 MED ORDER — GLUCAGON HCL RDNA (DIAGNOSTIC) 1 MG IJ SOLR
1.0000 mg | Freq: Once | INTRAMUSCULAR | Status: AC
  Administered 2019-09-27: 14:00:00 1 mg via INTRAVENOUS
  Filled 2019-09-27: qty 1

## 2019-09-27 MED ORDER — ONDANSETRON HCL 4 MG PO TABS: 4.0000 mg | ORAL_TABLET | Freq: Four times a day (QID) | ORAL | Status: DC | PRN

## 2019-09-27 NOTE — ED Notes (Signed)
Patient ambulated with this RN and MD, unable to ambulate without holding onto wall.

## 2019-09-27 NOTE — ED Triage Notes (Signed)
Pt reports around 2 this am she woke up to go to the bathroom and when she jumped up she felt dizzy and fell back into the bed. Pt reports that she has gotten up intermittently since then and still feels dizzy. Pt denies all other sx's. Pt denies pain, NV, headache, fever, cough.

## 2019-09-27 NOTE — ED Notes (Addendum)
Pt ambulated, mildly unsteady on feet. Pt had uneven gait and reached for wall to steady self several times. Balance appeared to be off. Pt reports that her head feels "less heavy than before," but that she is still just as dizzy as earlier. Pulse remained in the low 50's, SpO2 remained at 99-100, and BP maintained at around 128/82 both before and after ambulation.   Ellender Hose, MD notified

## 2019-09-27 NOTE — ED Notes (Signed)
Orthostatic VS: Lying: BP 120/82, HR 50 Sitting: BP 138/95, HR 52 Standing: BP 150/92, HR 48

## 2019-09-27 NOTE — ED Notes (Signed)
Patient given sandwich tray and coke

## 2019-09-27 NOTE — ED Notes (Signed)
Pt transported to room 107 

## 2019-09-27 NOTE — ED Notes (Signed)
Patient transported to MRI 

## 2019-09-27 NOTE — ED Provider Notes (Signed)
MRI of his back does not show any stroke or other reason for her to have her dizziness.  We try to walk the patient again after the Antivert she still very very unsteady and not safe.  She lives by herself.  She says that she has a week right shoulder and the left arm is numb and tingly after neck surgery.  She does not think she would handle a walker well.  We will see if we can get her in the hospital until we can get her walking better.   Nena Polio, MD 09/27/19 [phone]

## 2019-09-27 NOTE — ED Provider Notes (Signed)
[facility] Emergency Department Provider Note  ____________________________________________   First MD Initiated Contact with Patient 09/27/19 1108     (approximate)  I have reviewed the triage vital signs and the nursing notes.   HISTORY  Chief Complaint Dizziness    HPI Zoe Fields is a 63 y.o. female with past medical history as below here with reported dizziness.  Patient is somewhat difficult historian.  However, she reportedly has had a recent extensive neurological work-up for transient lapses in her memory and what she felt was left-sided facial droop.  She had a negative MRI performed just a week ago.  She states that earlier today, she began to sit up, and began to feel dizzy as well as mildly lightheaded when she sat up.  She states that this seems to occur every time she sits up, that resolves.  She has not had any vomiting.  No headache.  No focal numbness or weakness.  She does note that she occasionally has some tinnitus and sinus congestion, otherwise no complaints.        Past Medical History:  Diagnosis Date   Anxiety    Arthritis    Asthma    GERD (gastroesophageal reflux disease)    Hypertension     Patient Active Problem List   Diagnosis Date Noted   Rotator cuff tear 08/30/2018   Cervical radiculopathy 04/28/2018    Past Surgical History:  Procedure Laterality Date   ANTERIOR CERVICAL DECOMP/DISCECTOMY FUSION N/A 04/28/2018   Procedure: ANTERIOR CERVICAL DECOMPRESSION/DISCECTOMY FUSION 3 LEVELS-C6-T1;  Surgeon: Venetia Night, MD;  Location: [facility] ORS;  Service: Neurosurgery;  Laterality: N/A;   BUNIONECTOMY Bilateral    CARPAL TUNNEL RELEASE     CARPAL TUNNEL RELEASE Right    ECTOPIC PREGNANCY SURGERY     SHOULDER ARTHROSCOPY WITH OPEN ROTATOR CUFF REPAIR Right 08/30/2018   Procedure: SHOULDER ARTHROSCOPY WITH ROTATOR CUFF REPAIR, DISTAL CLAVICLE EXCISION, SUBACROMIAL DECOMPRESSION, SUBPECTORAL BICEPS  TENODESIS;  Surgeon: Signa Kell, MD;  Location: [facility] ORS;  Service: Orthopedics;  Laterality: Right;   TUBAL LIGATION      Prior to Admission medications   Medication Sig Start Date End Date Taking? Authorizing Provider  amLODipine (NORVASC) 5 MG tablet Take 5 mg by mouth at bedtime.     [provider]  Biotin 57846 MCG TABS Take 10,000 mcg by mouth at bedtime.    [provider]  citalopram (CELEXA) 10 MG tablet Take 10 mg by mouth at bedtime.     [provider]  estradiol (ESTRACE) 1 MG tablet Take 1 mg by mouth at bedtime.     [provider]  loratadine (CLARITIN) 10 MG tablet Take 10 mg by mouth at bedtime as needed for allergies.    [provider]  medroxyPROGESTERone (PROVERA) 2.5 MG tablet Take 2.5 mg by mouth at bedtime.     [provider]  ondansetron (ZOFRAN ODT) 4 MG disintegrating tablet Take 1 tablet (4 mg total) by mouth every 8 (eight) hours as needed for nausea or vomiting. 08/30/18   Signa Kell, MD  ondansetron (ZOFRAN) 4 MG tablet Take 1 tablet (4 mg total) by mouth every 6 (six) hours as needed for nausea. 08/31/18   Dedra Skeens, PA-C  oxyCODONE (OXY IR/ROXICODONE) 5 MG immediate release tablet Take 1-2 tablets (5-10 mg total) by mouth every 3 (three) hours as needed for moderate pain (pain score 4-6). 08/31/18   Dedra Skeens, PA-C  polyvinyl alcohol (LUBRICANT DROPS) 1.4 % ophthalmic  solution Place 1 drop into both eyes 4 (four) times daily as needed for dry eyes.    [provider]  pregabalin (LYRICA) 25 MG capsule Take 25 mg by mouth 2 (two) times daily.    [provider]  rosuvastatin (CRESTOR) 10 MG tablet Take 10 mg by mouth at bedtime. 07/12/18   [provider]  triamcinolone cream (KENALOG) 0.5 % Apply 1 application topically 2 (two) times daily as needed for rash or itching. 07/22/18   [provider]  triamterene-hydrochlorothiazide (MAXZIDE-25) 37.5-25 MG tablet Take 1  tablet by mouth at bedtime.     [provider]  metoprolol succinate (TOPROL-XL) 25 MG 24 hr tablet Take 25 mg by mouth at bedtime.   09/27/19  [provider]    Allergies Neomycin, Sulfa antibiotics, Benazepril, Eryc [erythromycin], Flexeril [cyclobenzaprine], and Hyzaar [losartan potassium-hctz]  Family History  Problem Relation Age of Onset   Breast cancer Neg Hx     Social History Social History   Tobacco Use   Smoking status: Former Smoker    Quit date: 2000    Years since quitting: 20.8   Smokeless tobacco: Never Used  Substance Use Topics   Alcohol use: Yes    Frequency: Never    Comment: occas   Drug use: No    Review of Systems  Review of Systems  Constitutional: Positive for fatigue. Negative for fever.  HENT: Negative for congestion and sore throat.   Eyes: Negative for visual disturbance.  Respiratory: Negative for cough and shortness of breath.   Cardiovascular: Negative for chest pain.  Gastrointestinal: Negative for abdominal pain, diarrhea, nausea and vomiting.  Genitourinary: Negative for flank pain.  Musculoskeletal: Negative for back pain and neck pain.  Skin: Negative for rash and wound.  Neurological: Positive for dizziness and light-headedness. Negative for weakness.  All other systems reviewed and are negative.    ____________________________________________  PHYSICAL EXAM:      VITAL SIGNS: ED Triage Vitals [09/27/19 0950]  Enc Vitals Group     BP 111/81     Pulse Rate (!) 51     Resp 20     Temp 98.5 F (36.9 C)     Temp Source Oral     SpO2 98 %     Weight 158 lb (71.7 kg)     Height  (1.549 m)     Head Circumference      Peak Flow      Pain Score 0     Pain Loc      Pain Edu?      Excl. in GC?      Physical Exam Vitals signs and nursing note reviewed.  Constitutional:      General: She is not in acute distress.    Appearance: She is well-developed.  HENT:     Head: Normocephalic and  atraumatic.     Comments: Moderate serous effusions bilaterally. No erythema of TMs.  Eyes:     Conjunctiva/sclera: Conjunctivae normal.     Comments: Severe strabismus at baseline. No appreciable nystagmus though limited 2/2 degree of strabismus.  Neck:     Musculoskeletal: Neck supple.  Cardiovascular:     Rate and Rhythm: Normal rate and regular rhythm.     Heart sounds: Normal heart sounds. No murmur. No friction rub.  Pulmonary:     Effort: Pulmonary effort is normal. No respiratory distress.     Breath sounds: Normal breath sounds. No wheezing or rales.  Abdominal:  General: There is no distension.     Palpations: Abdomen is soft.     Tenderness: There is no abdominal tenderness.  Skin:    General: Skin is warm.     Capillary Refill: Capillary refill takes less than 2 seconds.  Neurological:     Mental Status: She is alert and oriented to person, place, and time.     Motor: No abnormal muscle tone.     Comments: Possible subtle left NLF, which patient states is chronic. Tongue protrusion midline. CN otherwise intact. Strength 5/5 bilateral UE and LE. Normal sensation to light touch b/l UE and LE. Normal FTN and HTS. Gait slightly ataxic, requiring one person assist.       ____________________________________________   LABS (all labs ordered are listed, but only abnormal results are displayed)  Labs Reviewed  BASIC METABOLIC PANEL - Abnormal; Notable for the following components:      Result Value   Potassium 3.3 (*)    Glucose, Bld 114 (*)    Creatinine, Ser 1.03 (*)    GFR calc non Af Amer 58 (*)    All other components within normal limits  URINALYSIS, COMPLETE (UACMP) WITH MICROSCOPIC - Abnormal; Notable for the following components:   Color, Urine YELLOW (*)    APPearance CLEAR (*)    Bacteria, UA RARE (*)    All other components within normal limits  CBC  CBG MONITORING, ED    ____________________________________________  EKG: Sinus bradycardia, VR  50. QRS 82, QTc 410. No acute ST t segment changes. No signs of heart block, ischemia, or infarct.   EKG Interpretation  Date/Time:  Tuesday September 27 2019 09:56:25 EDT Ventricular Rate:  50 PR Interval:  180 QRS Duration: 82 QT Interval:  450 QTC Calculation: 410 R Axis:   39 Text Interpretation: Sinus bradycardia Otherwise normal ECG When compared with ECG of 20-Apr-2018 10:37, No significant change was found Confirmed by UNCONFIRMED, DOCTOR ([id number]), editor Fredric MareBailey, Babette Relicammy [phone]) on 09/27/2019 11:28:13 AM       ________________________________________  RADIOLOGY All imaging, including plain films, CT scans, and ultrasounds, independently reviewed by me, and interpretations confirmed via formal radiology reads.:     ED MD interpretation MRI pending   Official radiology report(s): Mr Brain Wo Contrast  Result Date: 09/27/2019 CLINICAL DATA:  Focal neuro deficit, greater than 6 hours, stroke suspected. Additional history provided: Dizziness EXAM: MRI HEAD WITHOUT CONTRAST TECHNIQUE: Multiplanar, multiecho pulse sequences of the brain and surrounding structures were obtained without intravenous contrast. COMPARISON:  Brain MRI 09/20/2019 FINDINGS: Brain: Multiple sequences are motion degraded. This includes moderate motion degradation of the axial acquired diffusion-weighted imaging. The coronal diffusion-weighted imaging is only minimally motion degraded and of good quality. There is no convincing evidence of acute infarct. No evidence of intracranial mass. No midline shift or extra-axial fluid collection. No chronic intracranial blood products. Stable mild scattered T2/FLAIR hyperintensities within the cerebral white matter which are nonspecific but consistent with chronic small vessel ischemic disease. Cerebral volume is normal for age. Vascular: Flow voids maintained within the proximal large arterial vessels. Skull and upper cervical spine: No focal marrow lesion. Sinuses/Orbits:  Unchanged complete opacification of the left maxillary and left frontal sinuses with partial opacification of left ethmoid air cells. Visualized orbits demonstrate no acute abnormality. IMPRESSION: 1. Motion degraded exam. 2. No evidence of acute intracranial abnormality. 3. Stable, mild chronic small vessel ischemic disease. 4. Unchanged sinusitis with complete opacification of the left frontal and left maxillary sinuses. Electronically Signed  By: Kellie Simmering DO   On: 09/27/2019 18:05    ____________________________________________  PROCEDURES   Procedure(s) performed (including Critical Care):  Procedures  ____________________________________________  INITIAL IMPRESSION / MDM / [place] / ED COURSE  As part of my medical decision making, I reviewed the following data within the electronic MEDICAL RECORD NUMBER Notes from prior ED visits and [place] Controlled Substance Database      *Zoe Fields was evaluated in Emergency Department on 09/27/2019 for the symptoms described in the history of present illness. She was evaluated in the context of the global COVID-19 pandemic, which necessitated consideration that the patient might be at risk for infection with the SARS-CoV-2 virus that causes COVID-19. Institutional protocols and algorithms that pertain to the evaluation of patients at risk for COVID-19 are in a state of rapid change based on information released by regulatory bodies including the CDC and federal and state organizations. These policies and algorithms were followed during the patient's care in the ED.  Some ED evaluations and interventions may be delayed as a result of limited staffing during the pandemic.*      Medical Decision Making:  63 yo F here with reported dizziness/lightheadedness upon sitting up and standing. Not orthostatic here. Pt is somewhat unclear regarding whether dizziness is due to vertigo versus lightheadedness. She was notably bradycardic here on  metoprolol, but has been bradycardic in the past. Glucagon given, HR in 60s w/o improvement. No high grade block. BP maintained. I suspect she may have some mild symptomatic bradycardia but do not feel this is etiology of her reported positional dizziness. Will have her hold her lopressor and discuss with PCP re: this.    On further questioning, pt now describing more vertiginous symptoms. Remains ataxic on exam. Will check MR to evaluate for possible CVA. Antivert given. Pt noted to have significant serous effusions bilaterally with significant sinusitis on MRI a week ago - will give decadron + meclizine. Current plan is to f/u MRI. If MRI is negative and symptoms improved, reasonable to treat as sinusitis w/ peripheral vertigo with meclizine and antihistamines +/- ABX. Pt updated and in agreement. Signed out to Dr. Cinda Quest.  ____________________________________________  FINAL CLINICAL IMPRESSION(S) / ED DIAGNOSES  Final diagnoses:  Dizziness  Acute recurrent maxillary sinusitis     MEDICATIONS GIVEN DURING THIS VISIT:  Medications  sodium chloride 0.9 % bolus 1,000 mL (0 mLs Intravenous Stopped 09/27/19 1309)  potassium chloride SA (KLOR-CON) CR tablet 40 mEq (40 mEq Oral Given 09/27/19 1212)  glucagon (human recombinant) (GLUCAGEN) injection 1 mg (1 mg Intravenous Given 09/27/19 1349)  ondansetron (ZOFRAN) injection 4 mg (4 mg Intravenous Given 09/27/19 1350)  LORazepam (ATIVAN) injection 1 mg (1 mg Intravenous Given 09/27/19 1350)  dexamethasone (DECADRON) injection 10 mg (10 mg Intravenous Given 09/27/19 1350)  meclizine (ANTIVERT) tablet 12.5 mg (12.5 mg Oral Given 09/27/19 1637)     ED Discharge Orders    None       Note:  This document was prepared using Dragon voice recognition software and may include unintentional dictation errors.   Duffy Bruce, MD 09/27/19 [phone]

## 2019-09-27 NOTE — ED Triage Notes (Signed)
Pt reports in the past couple of weeks she has had an [place] and a MRI due to new problems with her memory. Pt states that those tests were negative and now she is scheduled for an EEG. Pt with one eye squinted, reports that she is not in pain but has strabismus and it helps for her to close one eye sometimes.

## 2019-09-27 NOTE — H&P (Signed)
[facility] - [place] [facility]   PATIENT NAME: Zoe Fields    MR#:  [id number]  DATE OF BIRTH:  1956-04-02  DATE OF ADMISSION:  09/27/2019  PRIMARY CARE PHYSICIAN: Patrice ParadiseMcLaughlin, Miriam K, MD   REQUESTING/REFERRING PHYSICIAN: Dr. Juliette AlcideMelinda  CHIEF COMPLAINT:   Chief Complaint  Patient presents with  . Dizziness    HISTORY OF PRESENT ILLNESS:  Zoe Fields  is a 63 y.o. female with a known history of hypertension, GERD, anxiety presents to the emergency room complaining of acute onset of dizziness with change in position since yesterday night.  This is a room spinning sensation.  No focal weakness.  Patient had some facial droop and memory problems a week back and was seen by neurology as outpatient.  Had MRI of the brain with and without contrast which showed nothing acute.  Mild sinusitis.  Patient symptoms from that episode have resolved.  Today she got meclizine, Decadron, Zofran, Ativan in the emergency room with no improvement in the dizziness and patient is being admitted under observation.  She had a repeat MRI of the brain today after the normal MRI 1 week back.  Today's MRI of the brain showed nothing acute.  PAST MEDICAL HISTORY:   Past Medical History:  Diagnosis Date  . Anxiety   . Arthritis   . Asthma   . GERD (gastroesophageal reflux disease)   . Hypertension     PAST SURGICAL HISTORY:   Past Surgical History:  Procedure Laterality Date  . ANTERIOR CERVICAL DECOMP/DISCECTOMY FUSION N/A 04/28/2018   Procedure: ANTERIOR CERVICAL DECOMPRESSION/DISCECTOMY FUSION 3 LEVELS-C6-T1;  Surgeon: Venetia NightYarbrough, Chester, MD;  Location: [facility] ORS;  Service: Neurosurgery;  Laterality: N/A;  . BUNIONECTOMY Bilateral   . CARPAL TUNNEL RELEASE    . CARPAL TUNNEL RELEASE Right   . ECTOPIC PREGNANCY SURGERY    . SHOULDER ARTHROSCOPY WITH OPEN ROTATOR CUFF REPAIR Right 08/30/2018   Procedure: SHOULDER ARTHROSCOPY WITH ROTATOR CUFF REPAIR, DISTAL CLAVICLE EXCISION,  SUBACROMIAL DECOMPRESSION, SUBPECTORAL BICEPS TENODESIS;  Surgeon: Signa KellPatel, Sunny, MD;  Location: [facility] ORS;  Service: Orthopedics;  Laterality: Right;  . TUBAL LIGATION      SOCIAL HISTORY:   Social History   Tobacco Use  . Smoking status: Former Smoker    Quit date: 2000    Years since quitting: 20.8  . Smokeless tobacco: Never Used  Substance Use Topics  . Alcohol use: Yes    Frequency: Never    Comment: occas    FAMILY HISTORY:   Family History  Problem Relation Age of Onset  . Breast cancer Neg Hx     DRUG ALLERGIES:   Allergies  Allergen Reactions  . Neomycin     hives  . Sulfa Antibiotics     Redness   . Benazepril Other (See Comments)    Pt is unaware of reaction type  . Eryc [Erythromycin] Other (See Comments)    Pt is unaware of reaction type  . Flexeril [Cyclobenzaprine] Other (See Comments)    Hallucinate  . Hyzaar [Losartan Potassium-Hctz] Swelling    REVIEW OF SYSTEMS:   Review of Systems  Constitutional: Positive for malaise/fatigue. Negative for chills and fever.  HENT: Negative for sore throat.   Eyes: Negative for blurred vision, double vision and pain.  Respiratory: Negative for cough, hemoptysis, shortness of breath and wheezing.   Cardiovascular: Negative for chest pain, palpitations, orthopnea and leg swelling.  Gastrointestinal: Negative for abdominal pain, constipation, diarrhea, heartburn, nausea and vomiting.  Genitourinary: Negative for dysuria  and hematuria.  Musculoskeletal: Negative for back pain and joint pain.  Skin: Negative for rash.  Neurological: Positive for dizziness. Negative for sensory change, speech change, focal weakness and headaches.  Endo/Heme/Allergies: Does not bruise/bleed easily.  Psychiatric/Behavioral: Negative for depression. The patient is not nervous/anxious.     MEDICATIONS AT HOME:   Prior to Admission medications   Medication Sig Start Date End Date Taking? Authorizing Provider  amLODipine (NORVASC)  5 MG tablet Take 5 mg by mouth at bedtime.     [provider]  Biotin 57846 MCG TABS Take 10,000 mcg by mouth at bedtime.    [provider]  citalopram (CELEXA) 10 MG tablet Take 10 mg by mouth at bedtime.     [provider]  estradiol (ESTRACE) 1 MG tablet Take 1 mg by mouth at bedtime.     [provider]  loratadine (CLARITIN) 10 MG tablet Take 10 mg by mouth at bedtime as needed for allergies.    [provider]  medroxyPROGESTERone (PROVERA) 2.5 MG tablet Take 2.5 mg by mouth at bedtime.     [provider]  ondansetron (ZOFRAN ODT) 4 MG disintegrating tablet Take 1 tablet (4 mg total) by mouth every 8 (eight) hours as needed for nausea or vomiting. 08/30/18   Signa Kell, MD  ondansetron (ZOFRAN) 4 MG tablet Take 1 tablet (4 mg total) by mouth every 6 (six) hours as needed for nausea. 08/31/18   Dedra Skeens, PA-C  oxyCODONE (OXY IR/ROXICODONE) 5 MG immediate release tablet Take 1-2 tablets (5-10 mg total) by mouth every 3 (three) hours as needed for moderate pain (pain score 4-6). 08/31/18   Dedra Skeens, PA-C  polyvinyl alcohol (LUBRICANT DROPS) 1.4 % ophthalmic solution Place 1 drop into both eyes 4 (four) times daily as needed for dry eyes.    [provider]  pregabalin (LYRICA) 25 MG capsule Take 25 mg by mouth 2 (two) times daily.    [provider]  rosuvastatin (CRESTOR) 10 MG tablet Take 10 mg by mouth at bedtime. 07/12/18   [provider]  triamcinolone cream (KENALOG) 0.5 % Apply 1 application topically 2 (two) times daily as needed for rash or itching. 07/22/18   [provider]  triamterene-hydrochlorothiazide (MAXZIDE-25) 37.5-25 MG tablet Take 1 tablet by mouth at bedtime.     [provider]  metoprolol succinate (TOPROL-XL) 25 MG 24 hr tablet Take 25 mg by mouth at bedtime.   09/27/19  [provider]     VITAL SIGNS:  Blood pressure 113/78, pulse (!) 50, temperature  98.5 F (36.9 C), temperature source Oral, resp. rate 14, height 5\' 1"  (1.549 m), weight 71.7 kg, SpO2 100 %.  PHYSICAL EXAMINATION:  Physical Exam  GENERAL:  63 y.o.-year-old patient lying in the bed with no acute distress.  EYES: Pupils equal, round, reactive to light and accommodation. No scleral icterus. Extraocular muscles intact.  Strabismus HEENT: Head atraumatic, normocephalic. Oropharynx and nasopharynx clear. No oropharyngeal erythema, moist oral mucosa  NECK:  Supple, no jugular venous distention. No thyroid enlargement, no tenderness.  LUNGS: Normal breath sounds bilaterally, no wheezing, rales, rhonchi. No use of accessory muscles of respiration.  CARDIOVASCULAR: S1, S2 normal. No murmurs, rubs, or gallops.  ABDOMEN: Soft, nontender, nondistended. Bowel sounds present. No organomegaly or mass.  EXTREMITIES: No pedal edema, cyanosis, or clubbing. + 2 pedal & radial pulses b/l.   NEUROLOGIC: Cranial nerves II through XII are intact. No focal Motor or sensory deficits appreciated b/l  PSYCHIATRIC: The patient is alert and oriented x 3. Good affect.  SKIN: No obvious rash, lesion, or ulcer.   LABORATORY PANEL:   CBC Recent Labs  Lab 09/27/19 1002  WBC 7.4  HGB 13.4  HCT 40.7  PLT 323   ------------------------------------------------------------------------------------------------------------------  Chemistries  Recent Labs  Lab 09/27/19 1002  NA 138  K 3.3*  CL 101  CO2 26  GLUCOSE 114*  BUN 17  CREATININE 1.03*  CALCIUM 9.5   ------------------------------------------------------------------------------------------------------------------  Cardiac Enzymes No results for input(s): TROPONINI in the last 168 hours. ------------------------------------------------------------------------------------------------------------------  RADIOLOGY:  Mr Brain Wo Contrast  Result Date: 09/27/2019 CLINICAL DATA:  Focal neuro deficit, greater than 6 hours, stroke  suspected. Additional history provided: Dizziness EXAM: MRI HEAD WITHOUT CONTRAST TECHNIQUE: Multiplanar, multiecho pulse sequences of the brain and surrounding structures were obtained without intravenous contrast. COMPARISON:  Brain MRI 09/20/2019 FINDINGS: Brain: Multiple sequences are motion degraded. This includes moderate motion degradation of the axial acquired diffusion-weighted imaging. The coronal diffusion-weighted imaging is only minimally motion degraded and of good quality. There is no convincing evidence of acute infarct. No evidence of intracranial mass. No midline shift or extra-axial fluid collection. No chronic intracranial blood products. Stable mild scattered T2/FLAIR hyperintensities within the cerebral white matter which are nonspecific but consistent with chronic small vessel ischemic disease. Cerebral volume is normal for age. Vascular: Flow voids maintained within the proximal large arterial vessels. Skull and upper cervical spine: No focal marrow lesion. Sinuses/Orbits: Unchanged complete opacification of the left maxillary and left frontal sinuses with partial opacification of left ethmoid air cells. Visualized orbits demonstrate no acute abnormality. IMPRESSION: 1. Motion degraded exam. 2. No evidence of acute intracranial abnormality. 3. Stable, mild chronic small vessel ischemic disease. 4. Unchanged sinusitis with complete opacification of the left frontal and left maxillary sinuses. Electronically Signed   By: Kellie Simmering DO   On: 09/27/2019 18:05     IMPRESSION AND PLAN:   *Benign positional vertigo.  Will start scheduled meclizine.  Physical therapy consultation.  Fall precautions.  Up with assistance.  Will need ENT follow-up after discharge.  Observation admission. MRI of the brain normal  *Hypertension.  Continue home medications  *Anxiety.  Continue home medications  DVT prophylaxis with Lovenox  All the records are reviewed and case discussed with ED  provider. Management plans discussed with the patient, family and they are in agreement.  CODE STATUS: Full code  TOTAL TIME TAKING CARE OF THIS PATIENT: 40 minutes.   Leia Alf Tywanda Rice M.D on 09/27/2019 at 6:54 PM  Between 7am to 6pm - Pager - [phone]  After 6pm go to [email or web address] - password [email or web address] [facility] [facility]  Office  [phone]  CC: Primary care physician; Marinda Elk, MD  Note: This dictation was prepared with Dragon dictation along with smaller phrase technology. Any transcriptional errors that result from this process are unintentional.

## 2019-09-27 NOTE — ED Notes (Signed)
ED TO INPATIENT HANDOFF REPORT  ED Nurse Name and Phone #: Cala Bradford [phone]  S Name/Age/Gender Zoe Fields 63 y.o. female Room/Bed: ED18A/ED18A  Code Status   Code Status: Full Code  Home/SNF/Other Home Patient oriented to: self, place, time and situation Is this baseline? Yes   Triage Complete: Triage complete  Chief Complaint Dizziness  Triage Note Pt reports around 2 this am she woke up to go to the bathroom and when she jumped up she felt dizzy and fell back into the bed. Pt reports that she has gotten up intermittently since then and still feels dizzy. Pt denies all other sx's. Pt denies pain, NV, headache, fever, cough.   Pt reports in the past couple of weeks she has had an [place] and a MRI due to new problems with her memory. Pt states that those tests were negative and now she is scheduled for an EEG. Pt with one eye squinted, reports that she is not in pain but has strabismus and it helps for her to close one eye sometimes.    Allergies Allergies  Allergen Reactions  . Neomycin     hives  . Sulfa Antibiotics     Redness   . Benazepril Other (See Comments)    Pt is unaware of reaction type  . Eryc [Erythromycin] Other (See Comments)    Pt is unaware of reaction type  . Flexeril [Cyclobenzaprine] Other (See Comments)    Hallucinate  . Hyzaar [Losartan Potassium-Hctz] Swelling    Level of Care/Admitting Diagnosis ED Disposition    ED Disposition Condition Comment   Admit  Hospital Area: [facility] [[id number]]  Level of Care: Med-Surg [16]  Covid Evaluation: Asymptomatic Screening Protocol (No Symptoms)  Diagnosis: Vertigo [207257]  Admitting Physician: Milagros Loll [[id number]]  Attending Physician: Milagros Loll [[id number]]  PT Class (Do Not Modify): Observation [104]  PT Acc Code (Do Not Modify): Observation [10022]       B Medical/Surgery History Past Medical History:  Diagnosis Date  . Anxiety   . Arthritis   . Asthma   .  GERD (gastroesophageal reflux disease)   . Hypertension    Past Surgical History:  Procedure Laterality Date  . ANTERIOR CERVICAL DECOMP/DISCECTOMY FUSION N/A 04/28/2018   Procedure: ANTERIOR CERVICAL DECOMPRESSION/DISCECTOMY FUSION 3 LEVELS-C6-T1;  Surgeon: Venetia Night, MD;  Location: [facility] ORS;  Service: Neurosurgery;  Laterality: N/A;  . BUNIONECTOMY Bilateral   . CARPAL TUNNEL RELEASE    . CARPAL TUNNEL RELEASE Right   . ECTOPIC PREGNANCY SURGERY    . SHOULDER ARTHROSCOPY WITH OPEN ROTATOR CUFF REPAIR Right 08/30/2018   Procedure: SHOULDER ARTHROSCOPY WITH ROTATOR CUFF REPAIR, DISTAL CLAVICLE EXCISION, SUBACROMIAL DECOMPRESSION, SUBPECTORAL BICEPS TENODESIS;  Surgeon: Signa Kell, MD;  Location: [facility] ORS;  Service: Orthopedics;  Laterality: Right;  . TUBAL LIGATION       A IV Location/Drains/Wounds Patient Lines/Drains/Airways Status   Active Line/Drains/Airways    Name:   Placement date:   Placement time:   Site:   Days:   Peripheral IV 09/27/19 Left Antecubital   09/27/19    1221    Antecubital   less than 1   Incision (Closed) 04/28/18 Neck Other (Comment)   04/28/18    0825     517   Incision (Closed) 08/30/18 Shoulder Right   08/30/18    1049     393          Intake/Output Last 24 hours  Intake/Output Summary (Last 24 hours) at 09/27/2019 2008  Last data filed at 09/27/2019 1309 Gross per 24 hour  Intake 1000 ml  Output -  Net 1000 ml    Labs/Imaging Results for orders placed or performed during the hospital encounter of 09/27/19 (from the past 48 hour(s))  Basic metabolic panel     Status: Abnormal   Collection Time: 09/27/19 10:02 AM  Result Value Ref Range   Sodium 138 135 - 145 mmol/L   Potassium 3.3 (L) 3.5 - 5.1 mmol/L   Chloride 101 98 - 111 mmol/L   CO2 26 22 - 32 mmol/L   Glucose, Bld 114 (H) 70 - 99 mg/dL   BUN 17 8 - 23 mg/dL   Creatinine, Ser 4.091.03 (H) 0.44 - 1.00 mg/dL   Calcium 9.5 8.9 - 81.110.3 mg/dL   GFR calc non Af Amer 58 (L) >60 mL/min    GFR calc Af Amer >60 >60 mL/min   Anion gap 11 5 - 15    Comment: Performed at [facility], [address] [place], [place] [address]  CBC     Status: None   Collection Time: 09/27/19 10:02 AM  Result Value Ref Range   WBC 7.4 4.0 - 10.5 K/uL   RBC 4.46 3.87 - 5.11 MIL/uL   Hemoglobin 13.4 12.0 - 15.0 g/dL   HCT 29.540.7 62.136.0 - 30.846.0 %   MCV 91.3 80.0 - 100.0 fL   MCH 30.0 26.0 - 34.0 pg   MCHC 32.9 30.0 - 36.0 g/dL   RDW 65.713.9 84.611.5 - 96.215.5 %   Platelets 323 150 - 400 K/uL   nRBC 0.0 0.0 - 0.2 %    Comment: Performed at [facility], [address] [place], [place] [address]  Urinalysis, Complete w Microscopic     Status: Abnormal   Collection Time: 09/27/19 10:02 AM  Result Value Ref Range   Color, Urine YELLOW (A) YELLOW   APPearance CLEAR (A) CLEAR   Specific Gravity, Urine 1.018 1.005 - 1.030   pH 5.0 5.0 - 8.0   Glucose, UA NEGATIVE NEGATIVE mg/dL   Hgb urine dipstick NEGATIVE NEGATIVE   Bilirubin Urine NEGATIVE NEGATIVE   Ketones, ur NEGATIVE NEGATIVE mg/dL   Protein, ur NEGATIVE NEGATIVE mg/dL   Nitrite NEGATIVE NEGATIVE   Leukocytes,Ua NEGATIVE NEGATIVE   RBC / HPF 0-5 0 - 5 RBC/hpf   WBC, UA 0-5 0 - 5 WBC/hpf   Bacteria, UA RARE (A) NONE SEEN   Squamous Epithelial / LPF 0-5 0 - 5   Mucus PRESENT     Comment: Performed at [facility], [address] [place], [place] [address]   Mr Brain Wo Contrast  Result Date: 09/27/2019 CLINICAL DATA:  Focal neuro deficit, greater than 6 hours, stroke suspected. Additional history provided: Dizziness EXAM: MRI HEAD WITHOUT CONTRAST TECHNIQUE: Multiplanar, multiecho pulse sequences of the brain and surrounding structures were obtained without intravenous contrast. COMPARISON:  Brain MRI 09/20/2019 FINDINGS: Brain: Multiple sequences are motion degraded. This includes moderate motion degradation of the axial acquired diffusion-weighted imaging. The coronal diffusion-weighted imaging is only minimally  motion degraded and of good quality. There is no convincing evidence of acute infarct. No evidence of intracranial mass. No midline shift or extra-axial fluid collection. No chronic intracranial blood products. Stable mild scattered T2/FLAIR hyperintensities within the cerebral white matter which are nonspecific but consistent with chronic small vessel ischemic disease. Cerebral volume is normal for age. Vascular: Flow voids maintained within the proximal large arterial vessels. Skull and upper cervical spine: No  focal marrow lesion. Sinuses/Orbits: Unchanged complete opacification of the left maxillary and left frontal sinuses with partial opacification of left ethmoid air cells. Visualized orbits demonstrate no acute abnormality. IMPRESSION: 1. Motion degraded exam. 2. No evidence of acute intracranial abnormality. 3. Stable, mild chronic small vessel ischemic disease. 4. Unchanged sinusitis with complete opacification of the left frontal and left maxillary sinuses. Electronically Signed   By: Jackey Loge DO   On: 09/27/2019 18:05    Pending Labs Unresulted Labs (From admission, onward)    Start     Ordered   10/04/19 0500  Creatinine, serum  (enoxaparin (LOVENOX)    CrCl >/= 30 ml/min)  Weekly,   STAT    Comments: while on enoxaparin therapy    09/27/19 1839   09/28/19 0500  Basic metabolic panel  Tomorrow morning,   STAT     09/27/19 1839   09/28/19 0500  CBC  Tomorrow morning,   STAT     09/27/19 1839   09/27/19 1839  HIV Antibody (routine testing w rflx)  (HIV Antibody (Routine testing w reflex) panel)  Add-on,   AD     09/27/19 1839          Vitals/Pain Today's Vitals   09/27/19 1530 09/27/19 1600 09/27/19 1630 09/27/19 1700  BP: (!) 138/92 128/82 121/80 113/78  Pulse: 70 (!) 50    Resp: 19 15 (!) 26 14  Temp:      TempSrc:      SpO2: 100% 100%    Weight:      Height:      PainSc:        Isolation Precautions No active isolations  Medications Medications  meclizine  (ANTIVERT) tablet 25 mg (25 mg Oral Given 09/27/19 1854)  enoxaparin (LOVENOX) injection 40 mg (has no administration in time range)  acetaminophen (TYLENOL) tablet 650 mg (has no administration in time range)    Or  acetaminophen (TYLENOL) suppository 650 mg (has no administration in time range)  polyethylene glycol (MIRALAX / GLYCOLAX) packet 17 g (has no administration in time range)  ondansetron (ZOFRAN) tablet 4 mg (has no administration in time range)    Or  ondansetron (ZOFRAN) injection 4 mg (has no administration in time range)  albuterol (PROVENTIL) (2.5 MG/3ML) 0.083% nebulizer solution 2.5 mg (has no administration in time range)  sodium chloride 0.9 % bolus 1,000 mL (0 mLs Intravenous Stopped 09/27/19 1309)  potassium chloride SA (KLOR-CON) CR tablet 40 mEq (40 mEq Oral Given 09/27/19 1212)  glucagon (human recombinant) (GLUCAGEN) injection 1 mg (1 mg Intravenous Given 09/27/19 1349)  ondansetron (ZOFRAN) injection 4 mg (4 mg Intravenous Given 09/27/19 1350)  LORazepam (ATIVAN) injection 1 mg (1 mg Intravenous Given 09/27/19 1350)  dexamethasone (DECADRON) injection 10 mg (10 mg Intravenous Given 09/27/19 1350)  meclizine (ANTIVERT) tablet 12.5 mg (12.5 mg Oral Given 09/27/19 1637)  diazepam (VALIUM) tablet 5 mg (5 mg Oral Given 09/27/19 1854)    Mobility walks Low fall risk   Focused Assessments Neuro Assessment Handoff:  Swallow screen pass? Yes  Cardiac Rhythm: Sinus bradycardia   Last date known well: 09/26/19 Last time known well: 2330 Neuro Assessment: Within Defined Limits(strabismus) Neuro Checks:      Last Documented NIHSS Modified Score:   Has TPA been given? No If patient is a Neuro Trauma and patient is going to OR before floor call report to 4N Charge nurse: [phone] or [phone]     R Recommendations: See Admitting Provider Note  Report  given to:   Additional Notes: Dizziness

## 2019-09-27 NOTE — Discharge Instructions (Addendum)
As we discussed, I would STOP taking your metoprolol for now and call your PCP, as your heart rate was noted to be as low as 40s here in the ED.  Follow-up with primary care physician in 3 days Follow-up with neurology Dr. Melrose Nakayama in 1 month Follow-up with ENT Dr Pryor Ochoa in 1 week Follow-up with outpatient balance center-in 3 to 5 days Outpatient physical therapy

## 2019-09-28 LAB — CBC
HCT: 37 % (ref 36.0–46.0)
Hemoglobin: 12.6 g/dL (ref 12.0–15.0)
MCH: 30.7 pg (ref 26.0–34.0)
MCHC: 34.1 g/dL (ref 30.0–36.0)
MCV: 90 fL (ref 80.0–100.0)
Platelets: 277 10*3/uL (ref 150–400)
RBC: 4.11 MIL/uL (ref 3.87–5.11)
RDW: 13.5 % (ref 11.5–15.5)
WBC: 12.1 10*3/uL — ABNORMAL HIGH (ref 4.0–10.5)
nRBC: 0 % (ref 0.0–0.2)

## 2019-09-28 LAB — BASIC METABOLIC PANEL
Anion gap: 8 (ref 5–15)
BUN: 15 mg/dL (ref 8–23)
CO2: 24 mmol/L (ref 22–32)
Calcium: 9.1 mg/dL (ref 8.9–10.3)
Chloride: 105 mmol/L (ref 98–111)
Creatinine, Ser: 0.92 mg/dL (ref 0.44–1.00)
GFR calc Af Amer: >60 mL/min (ref >60)
GFR calc non Af Amer: >60 mL/min (ref >60)
Glucose, Bld: 209 mg/dL — ABNORMAL HIGH (ref 70–99)
Potassium: 3.8 mmol/L (ref 3.5–5.1)
Sodium: 137 mmol/L (ref 135–145)

## 2019-09-28 LAB — SARS CORONAVIRUS 2 (TAT 6-24 HRS): SARS Coronavirus 2: NEGATIVE

## 2019-09-28 LAB — HIV ANTIBODY (ROUTINE TESTING W REFLEX): HIV Screen 4th Generation wRfx: NONREACTIVE

## 2019-09-28 NOTE — Plan of Care (Signed)
Notify staff with any changes in vision other new changes in  her  health.

## 2019-09-28 NOTE — Progress Notes (Signed)
[place] [facility] [place] NAME: Zoe Fields    MR#:  [id number]  DATE OF BIRTH:  [birth year]  SUBJECTIVE:  she reports having a light headache. She had nausea earlier. Some dizziness but feels better than yesterday. No falls. Work with physical therapy earlier  REVIEW OF SYSTEMS:   Review of Systems  Constitutional: Negative for chills, fever and weight loss.  HENT: Negative for ear discharge, ear pain and nosebleeds.   Eyes: Negative for blurred vision, pain and discharge.  Respiratory: Negative for sputum production, shortness of breath, wheezing and stridor.   Cardiovascular: Negative for chest pain, palpitations, orthopnea and PND.  Gastrointestinal: Positive for nausea. Negative for abdominal pain, diarrhea and vomiting.  Genitourinary: Negative for frequency and urgency.  Musculoskeletal: Negative for back pain and joint pain.  Neurological: Positive for dizziness and weakness. Negative for sensory change, speech change and focal weakness.  Psychiatric/Behavioral: Negative for depression and hallucinations. The patient is not nervous/anxious.    Tolerating Diet:yes Tolerating PT:   DRUG ALLERGIES:   Allergies  Allergen Reactions  . Neomycin     hives  . Sulfa Antibiotics     Redness   . Benazepril Other (See Comments)    Pt is unaware of reaction type  . Eryc [Erythromycin] Other (See Comments)    Pt is unaware of reaction type  . Flexeril [Cyclobenzaprine] Other (See Comments)    Hallucinate  . Hyzaar [Losartan Potassium-Hctz] Swelling    VITALS:  Blood pressure 113/78, pulse (!) 56, temperature 98.6 F (37 C), temperature source Oral, resp. rate (!) 22, height 5\' 1"  (1.549 m), weight 72.5 kg, SpO2 99 %.  PHYSICAL EXAMINATION:   Physical Exam  GENERAL:  63 y.o.-year-old patient lying in the bed with no acute distress.  EYES: Pupils equal, round, reactive to light and accommodation. No scleral icterus. Extraocular  muscles intact.  HEENT: Head atraumatic, normocephalic. Oropharynx and nasopharynx clear.  NECK:  Supple, no jugular venous distention. No thyroid enlargement, no tenderness.  LUNGS: Normal breath sounds bilaterally, no wheezing, rales, rhonchi. No use of accessory muscles of respiration.  CARDIOVASCULAR: S1, S2 normal. No murmurs, rubs, or gallops.  ABDOMEN: Soft, nontender, nondistended. Bowel sounds present. No organomegaly or mass.  EXTREMITIES: No cyanosis, clubbing or edema b/l.    NEUROLOGIC: Cranial nerves II through XII are intact. No focal Motor or sensory deficits b/l.   PSYCHIATRIC:  patient is alert and oriented x 3.  SKIN: No obvious rash, lesion, or ulcer.   LABORATORY PANEL:  CBC Recent Labs  Lab 09/28/19 0452  WBC 12.1*  HGB 12.6  HCT 37.0  PLT 277    Chemistries  Recent Labs  Lab 09/28/19 0452  NA 137  K 3.8  CL 105  CO2 24  GLUCOSE 209*  BUN 15  CREATININE 0.92  CALCIUM 9.1   Cardiac Enzymes No results for input(s): TROPONINI in the last 168 hours. RADIOLOGY:  Mr Brain Wo Contrast  Result Date: 09/27/2019 CLINICAL DATA:  Focal neuro deficit, greater than 6 hours, stroke suspected. Additional history provided: Dizziness EXAM: MRI HEAD WITHOUT CONTRAST TECHNIQUE: Multiplanar, multiecho pulse sequences of the brain and surrounding structures were obtained without intravenous contrast. COMPARISON:  Brain MRI 09/20/2019 FINDINGS: Brain: Multiple sequences are motion degraded. This includes moderate motion degradation of the axial acquired diffusion-weighted imaging. The coronal diffusion-weighted imaging is only minimally motion degraded and of good quality. There is no convincing evidence of acute infarct. No evidence of intracranial  mass. No midline shift or extra-axial fluid collection. No chronic intracranial blood products. Stable mild scattered T2/FLAIR hyperintensities within the cerebral white matter which are nonspecific but consistent with chronic  small vessel ischemic disease. Cerebral volume is normal for age. Vascular: Flow voids maintained within the proximal large arterial vessels. Skull and upper cervical spine: No focal marrow lesion. Sinuses/Orbits: Unchanged complete opacification of the left maxillary and left frontal sinuses with partial opacification of left ethmoid air cells. Visualized orbits demonstrate no acute abnormality. IMPRESSION: 1. Motion degraded exam. 2. No evidence of acute intracranial abnormality. 3. Stable, mild chronic small vessel ischemic disease. 4. Unchanged sinusitis with complete opacification of the left frontal and left maxillary sinuses. Electronically Signed   By: Jackey Loge DO   On: 09/27/2019 18:05   ASSESSMENT AND PLAN:  Zoe Fields  is a 63 y.o. female with a known history of hypertension, GERD, anxiety presents to the emergency room complaining of acute onset of dizziness with change in position since yesterday night.  This is a room spinning sensation.  No focal weakness.    *Benign positional vertigo.   -cont scheduled meclizine.   -Physical therapy consultation fro [facility] - Fall precautions.  Up with assistance.  -Discussed with patient that she  Will need ENT follow-up after discharge.  -MRI of the brain normal except mild sinusitis frontal of maxillary sinuses  *Hypertension.  Continue home medications  *Anxiety.  Continue home medications  DVT prophylaxis with Lovenox  Patient would like to stay one more day. She is feeling better than yesterday. Anticipate discharged tomorrow with PO meclizine and ENT follow-up  Case discussed with Care Management/Social Worker. Management plans discussed with the patient and they are in agreement.  CODE STATUS: full  DVT Prophylaxis: *lovenox*  TOTAL TIME TAKING CARE OF THIS PATIENT: *30* minutes.  >50% time spent on counselling and coordination of care  POSSIBLE D/C IN *1* DAYS, DEPENDING ON CLINICAL CONDITION.  Note: This dictation was  prepared with Dragon dictation along with smaller phrase technology. Any transcriptional errors that result from this process are unintentional.  Enedina Finner M.D on 09/28/2019 at 2:24 PM  Between 7am to 6pm - Pager - [phone]  After 6pm go to [email or web address] - Social research officer, government  [facility]  Office  [phone]  CC: Primary care physician; Patrice Paradise, MDPatient ID: Delphia Grates, female   DOB: 1955-12-26, 63 y.o.   MRN: [id number]

## 2019-09-28 NOTE — Evaluation (Signed)
Physical Therapy Evaluation Patient Details Name: Delphia GratesDanette G Bienvenue MRN: [id number] DOB: 10-14-1956 Today's Date: 09/28/2019   History of Present Illness  Pt is a 63 y.o. female presenting to hospital 09/27/19 with dizziness and mildly lightheaded sitting up getting OOB to go to the bathroom.  Pt with recent extensive neurologic work-up for transient lapses in memory and what pt felt was L sided facial droop.  Pt (-) orthostatic in ED 10/27.  Pt also noted to be ataxic in ED.  MRI of brain 10/20 and 10/27 negative for acute infarct.  Pt admitted for work-up for symptoms.  PMH includes h/o ACDF C6-T1 04/28/2018, R CTR, R open RCR 2019, anxiety, htn.  Pt also reports weak R shoulder (h/o surgery) and B arm numbness/tingling/pain after neck surgery.  Pt remotes h/o ocular migraines and difficulty focusing with eyes (recently getting worse) and h/o motion sickness.  Clinical Impression  Prior to hospital admission, pt was independent.  Pt lives alone in 1 level home (with basement) with 3 steps to enter (no railing).  Pt reporting mild feeling of not feeling well (but not quite nauseas) during session in general (symptoms did not change from beginning to end of session); pt also reporting sense of "heaviness" through entire body when ambulating (resolved once not ambulating).  Pt reports chronic B UE numbness/tingling/pain that was 7/10 beginning/during/end of session (no change of symptoms during session).  Currently pt is modified independent with bed mobility, independent with transfers, and CGA with ambulation in room (no AD).  Pt appearing more cautious with gait but no loss of balance noted.  MD Vernon PreyPatel messaged therapist that PT was for BPPV.  Neck screened and cleared (no neck pain/symptom's and no change in UE symptoms per pt report) for required neck positions to perform Dix-Hallpike test to assess for BPPV (test modified with pillow under pt's shoulders and bed in mild trendelenburg position d/t h/o neck  surgery): no nystagmus noted with test of R and L side (test negative for symptoms for BPPV).  Of note (in general), therapist noted (and occurs B) that only one of pt's eyes would focus on object (other eye would veer laterally) with inconsistencies with tracking: pt reports h/o eyes not focusing evenly (pt gets cues to focus/move eyes when getting pictures taken) but pt reports it has been getting worse recently (nurse notified and came to assess pt and reported she would notify MD).  Pt would benefit from skilled PT to address noted impairments and functional limitations (see below for any additional details).  Upon hospital discharge, pt would benefit from ENT referral and OP PT.    Follow Up Recommendations Outpatient PT    Equipment Recommendations  None recommended by PT    Recommendations for Other Services       Precautions / Restrictions Restrictions Weight Bearing Restrictions: No      Mobility  Bed Mobility Overal bed mobility: Modified Independent             General bed mobility comments: Supine to/from sit without any noted difficulties  Transfers Overall transfer level: Independent Equipment used: None             General transfer comment: steady safe transfers noted  Ambulation/Gait Ambulation/Gait assistance: Min guard Gait Distance (Feet): 40 Feet Assistive device: None   Gait velocity: decreased   General Gait Details: more cautious/slow gait; decreased B LE step length; no UE support; no loss of balance noted  Stairs  Wheelchair Mobility    Modified Rankin (Stroke Patients Only)       Balance Overall balance assessment: Needs assistance Sitting-balance support: No upper extremity supported;Feet supported Sitting balance-Leahy Scale: Normal Sitting balance - Comments: steady sitting reaching within BOS   Standing balance support: No upper extremity supported;During functional activity Standing balance-Leahy Scale:  Good Standing balance comment: more cautious mobility but no loss of balance noted                             Pertinent Vitals/Pain Pain Assessment: 0-10 Pain Location: chronic B UE pain 7/10; slight headache Pain Intervention(s): Limited activity within patient's tolerance;Monitored during session;Repositioned    Home Living Family/patient expects to be discharged to:: Private residence Living Arrangements: Alone Available Help at Discharge: Family Type of Home: House Home Access: Stairs to enter Entrance Stairs-Rails: None Entrance Stairs-Number of Steps: 3 Home Layout: One level;Laundry or work area in [employer]: None      Prior Function Level of Independence: Independent               Journalist, newspaper        Extremity/Trunk Assessment   Upper Extremity Assessment Upper Extremity Assessment: Overall WFL for tasks assessed(pt reports R shoulder weaker s/p R shoulder surgery; L>R hand grip strength (but overall WFL); at least 3/5 AROM B shoulder flexion and elbow flexion/extension)    Lower Extremity Assessment Lower Extremity Assessment: (B hip flexion 4/5; B knee flexion/extesnion 4+/5; B DF 4+/5; intact B LE heel to shin coordination, tone, sensation, and proprioception)    Cervical / Trunk Assessment Cervical / Trunk Assessment: Normal  Communication   Communication: No difficulties  Cognition Arousal/Alertness: Awake/alert Behavior During Therapy: WFL for tasks assessed/performed Overall Cognitive Status: Within Functional Limits for tasks assessed                                        General Comments   Nursing cleared pt for participation in physical therapy.  Pt agreeable to PT session.    Exercises     Assessment/Plan    PT Assessment Patient needs continued PT services  PT Problem List Decreased balance;Decreased mobility;Decreased knowledge of precautions;Pain       PT Treatment Interventions DME  instruction;Gait training;Stair training;Functional mobility training;Therapeutic activities;Therapeutic exercise;Balance training;Patient/family education    PT Goals (Current goals can be found in the Care Plan section)  Acute Rehab PT Goals Patient Stated Goal: to improve mobility and symptoms PT Goal Formulation: With patient Time For Goal Achievement: 10/12/19 Potential to Achieve Goals: Fair    Frequency Min 2X/week   Barriers to discharge        Co-evaluation               AM-PAC PT "6 Clicks" Mobility  Outcome Measure Help needed turning from your back to your side while in a flat bed without using bedrails?: None Help needed moving from lying on your back to sitting on the side of a flat bed without using bedrails?: None Help needed moving to and from a bed to a chair (including a wheelchair)?: None Help needed standing up from a chair using your arms (e.g., wheelchair or bedside chair)?: None Help needed to walk in hospital room?: A Little Help needed climbing 3-5 steps with a railing? : A Little 6 Click Score: 22  End of Session Equipment Utilized During Treatment: Gait belt Activity Tolerance: Patient tolerated treatment well;No increased pain Patient left: in bed;with call bell/phone within reach;with nursing/sitter in room Nurse Communication: Mobility status;Precautions;Other (comment)(pt's ocular impairments) PT Visit Diagnosis: Dizziness and giddiness (R42);Other abnormalities of gait and mobility (R26.89)    Time: 9983-3825 PT Time Calculation (min) (ACUTE ONLY): 42 min   Charges:   PT Evaluation $PT Eval Low Complexity: 1 Low PT Treatments $Therapeutic Activity: 8-22 mins        Hendricks Limes, PT 09/28/19, 12:41 PM [phone]

## 2019-09-29 DIAGNOSIS — R42 Dizziness and giddiness: Secondary | ICD-10-CM

## 2019-09-29 LAB — CBC
HCT: 36.4 % (ref 36.0–46.0)
Hemoglobin: 12 g/dL (ref 12.0–15.0)
MCH: 30.2 pg (ref 26.0–34.0)
MCHC: 33 g/dL (ref 30.0–36.0)
MCV: 91.7 fL (ref 80.0–100.0)
Platelets: 259 10*3/uL (ref 150–400)
RBC: 3.97 MIL/uL (ref 3.87–5.11)
RDW: 13.8 % (ref 11.5–15.5)
WBC: 14.9 10*3/uL — ABNORMAL HIGH (ref 4.0–10.5)
nRBC: 0 % (ref 0.0–0.2)

## 2019-09-29 LAB — C-REACTIVE PROTEIN: CRP: 0.9 mg/dL (ref <1.0)

## 2019-09-29 LAB — SEDIMENTATION RATE: Sed Rate: 26 mm/hr (ref 0–30)

## 2019-09-29 LAB — HEMOGLOBIN A1C
Hgb A1c MFr Bld: 6 % — ABNORMAL HIGH (ref 4.8–5.6)
Mean Plasma Glucose: 125.5 mg/dL

## 2019-09-29 LAB — VITAMIN B12: Vitamin B-12: 486 pg/mL (ref 180–914)

## 2019-09-29 LAB — TSH: TSH: 5.373 u[IU]/mL — ABNORMAL HIGH (ref 0.350–4.500)

## 2019-09-29 MED ORDER — AMOXICILLIN-POT CLAVULANATE 875-125 MG PO TABS
1.0000 | ORAL_TABLET | Freq: Two times a day (BID) | ORAL | Status: DC
  Administered 2019-09-29 – 2019-09-30 (×3): 1 via ORAL
  Filled 2019-09-29 (×3): qty 1

## 2019-09-29 NOTE — Plan of Care (Signed)
Problem: Health Behavior/Discharge Planning: Goal: Ability to manage health-related needs will improve Outcome: Progressing  Pt understands her POC and has no further questions.     Problem: Clinical Measurements: Goal: Will remain free from infection Outcome: Progressing S/Sx of infection monitor and assessed q-shift/ PRN, along with VS.  Pt has been afebrile thus far.  Will continue to monitor and assess.    Problem: Activity: Goal: Risk for activity intolerance will decrease Outcome: Progressing  PT able to ambulate to the BR independently.   Problem: Pain Managment: Goal: General experience of comfort will improve Outcome: Progressing Pt has c/o 8/10 anterior headache describing it as an occasional ache.  Reiterated pain scale so she could adequately rate her pain.  Discussed nonpharmacological methods to help reduce s/sx of pain.  Interventions given per pt's request and MD's orders.    Problem: Safety: Goal: Ability to remain free from injury will improve Outcome: Progressing Instructed pt to utilize RN call light for assistance.  Hourly rounds performed.  Bed in lowest position, locked with two upper side rails engaged.  Belongings and call light within reach.    Problem: Skin Integrity: Goal: Risk for impaired skin integrity will decrease Outcome: Progressing Skin integrity monitored and assessed q-shift/ PRN.  Instructed pt to turn q2 hours to prevent further skin impairment.  Tubes and drains assessed for device related pressure sores.  Will continue to monitor and assess.

## 2019-09-29 NOTE — Plan of Care (Signed)
+  Patient continues to heave difficulty with her vision.  Probale discharge tomorrow

## 2019-09-29 NOTE — Progress Notes (Signed)
[facility] - [place] at [facility]   PATIENT NAME: Zoe Fields    MR#:  [id number]  DATE OF BIRTH:  [birth year]  SUBJECTIVE:  she reports having a light headache. She had nausea earlier.  Denies room spinning around her denies any ringing in her ears.  No similar complaints in the past.  Still feeling lightheaded today but not orthostatic Reporting postnasal drip.  No fever but white blood cell count is trending up Denies any cough or shortness of breath  REVIEW OF SYSTEMS:   Review of Systems  Constitutional: Negative for chills, fever and weight loss.  HENT: Negative for ear discharge, ear pain and nosebleeds.   Eyes: Negative for blurred vision, pain and discharge.  Respiratory: Negative for sputum production, shortness of breath, wheezing and stridor.   Cardiovascular: Negative for chest pain, palpitations, orthopnea and PND.  Gastrointestinal: Positive for nausea. Negative for abdominal pain, diarrhea and vomiting.  Genitourinary: Negative for frequency and urgency.  Musculoskeletal: Negative for back pain and joint pain.  Neurological: Positive for dizziness and weakness. Negative for sensory change, speech change and focal weakness.  Psychiatric/Behavioral: Negative for depression and hallucinations. The patient is not nervous/anxious.    Tolerating Diet:yes Tolerating PT:   DRUG ALLERGIES:   Allergies  Allergen Reactions  . Neomycin     hives  . Sulfa Antibiotics     Redness   . Benazepril Other (See Comments)    Pt is unaware of reaction type  . Eryc [Erythromycin] Other (See Comments)    Pt is unaware of reaction type  . Flexeril [Cyclobenzaprine] Other (See Comments)    Hallucinate  . Hyzaar [Losartan Potassium-Hctz] Swelling    VITALS:  Blood pressure 111/64, pulse 70, temperature 98.2 F (36.8 C), resp. rate 18, height 5\' 1"  (1.549 m), weight 72.5 kg, SpO2 98 %.  PHYSICAL EXAMINATION:   Physical Exam  GENERAL:  63  y.o.-year-old patient lying in the bed with no acute distress.  EYES: Pupils equal, round, reactive to light and accommodation. No scleral icterus. Extraocular muscles intact.  HEENT: Head atraumatic, normocephalic. Oropharynx and nasopharynx clear.  NECK:  Supple, no jugular venous distention. No thyroid enlargement, no tenderness.  LUNGS: Normal breath sounds bilaterally, no wheezing, rales, rhonchi. No use of accessory muscles of respiration.  CARDIOVASCULAR: S1, S2 normal. No murmurs, rubs, or gallops.  ABDOMEN: Soft, nontender, nondistended. Bowel sounds present.  EXTREMITIES: No cyanosis, clubbing or edema b/l.    NEUROLOGIC: Cranial nerves II through XII are intact. No focal Motor or sensory deficits b/l.  Positive horizontal nystagmus PSYCHIATRIC:  patient is alert and oriented x 3.  SKIN: No obvious rash, lesion, or ulcer.   LABORATORY PANEL:  CBC Recent Labs  Lab 09/29/19 0942  WBC 14.9*  HGB 12.0  HCT 36.4  PLT 259    Chemistries  Recent Labs  Lab 09/28/19 0452  NA 137  K 3.8  CL 105  CO2 24  GLUCOSE 209*  BUN 15  CREATININE 0.92  CALCIUM 9.1   Cardiac Enzymes No results for input(s): TROPONINI in the last 168 hours. RADIOLOGY:  Mr Brain Wo Contrast  Result Date: 09/27/2019 CLINICAL DATA:  Focal neuro deficit, greater than 6 hours, stroke suspected. Additional history provided: Dizziness EXAM: MRI HEAD WITHOUT CONTRAST TECHNIQUE: Multiplanar, multiecho pulse sequences of the brain and surrounding structures were obtained without intravenous contrast. COMPARISON:  Brain MRI 09/20/2019 FINDINGS: Brain: Multiple sequences are motion degraded. This includes moderate motion degradation of the axial acquired  diffusion-weighted imaging. The coronal diffusion-weighted imaging is only minimally motion degraded and of good quality. There is no convincing evidence of acute infarct. No evidence of intracranial mass. No midline shift or extra-axial fluid collection. No chronic  intracranial blood products. Stable mild scattered T2/FLAIR hyperintensities within the cerebral white matter which are nonspecific but consistent with chronic small vessel ischemic disease. Cerebral volume is normal for age. Vascular: Flow voids maintained within the proximal large arterial vessels. Skull and upper cervical spine: No focal marrow lesion. Sinuses/Orbits: Unchanged complete opacification of the left maxillary and left frontal sinuses with partial opacification of left ethmoid air cells. Visualized orbits demonstrate no acute abnormality. IMPRESSION: 1. Motion degraded exam. 2. No evidence of acute intracranial abnormality. 3. Stable, mild chronic small vessel ischemic disease. 4. Unchanged sinusitis with complete opacification of the left frontal and left maxillary sinuses. Electronically Signed   By: Kellie Simmering DO   On: 09/27/2019 18:05   ASSESSMENT AND PLAN:  Zoe Fields  is a 63 y.o. female with a known history of hypertension, GERD, anxiety presents to the emergency room complaining of acute onset of dizziness with change in position since yesterday night.  This is a room spinning sensation.  No focal weakness.    *Dizziness could be from acute sinusitis with horizontal nystagmus -Patient claims meclizine is not helping her  -Physical therapy consultation fro [facility] - Fall precautions.  Up with assistance.  -Discussed with patient that she  Will need ENT follow-up after discharge.  -MRI of the brain n with complete opacification of the left frontal and left maxillary sinuses -Patient is started on Augmentin p.o. as well blood cell count is elevated -Urinalysis is normal -Seen by neurology Dr. Doy Mince who has ordered sed rate, CRP, B12 level, TSH, Lyme titer, A1c and angiotensin-converting enzyme -Frequent neuro checks   *Hypertension.  Continue home medications  *Anxiety.  Continue home medications  DVT prophylaxis with Lovenox  Patient would like to stay one more day.  She is feeling better than yesterday. Anticipate discharged tomorrow  and ENT follow-up  Case discussed with Care Management/Social Worker. Management plans discussed with the patient and they are in agreement.  CODE STATUS: full  DVT Prophylaxis: *lovenox*  TOTAL TIME TAKING CARE OF THIS PATIENT: *36* minutes.  >50% time spent on counselling and coordination of care  POSSIBLE D/C IN *1* DAYS, DEPENDING ON CLINICAL CONDITION.  Note: This dictation was prepared with Dragon dictation along with smaller phrase technology. Any transcriptional errors that result from this process are unintentional.  Nicholes Mango M.D on 09/29/2019 at 4:39 PM  Between 7am to 6pm - Pager - [phone]  After 6pm go to [email or web address] - password [email or web address] [facility]  Office  [phone]  CC: Primary care physician; Marinda Elk, MDPatient ID: Zoe Fields, female   DOB: Apr 21, 1956, 63 y.o.   MRN: [id number]

## 2019-09-29 NOTE — Consult Note (Signed)
Reason for Consult:Vertigo Referring Physician: Gouru  CC: Vertigo  HPI: Zoe Fields is an 63 y.o. female with a history of HTN who reports awakening on the day of admission with the feeling as if her head was heavy and unable to be lifted out of the bed.  When she got up she felt off balance and as if the floor was tilted.  Remained dizzy and presented for evaluation.   Patient reports still being "dizzy" although improved from her initial day of presentation.   Patient was recently seen by her PCP and complained of a facial droop that occurred about 2 years ago and having an episode of leaving to go do errands and forgetting what she had gone out for.  MRI of the brain was performed at that time and was unremarkable.     Past Medical History:  Diagnosis Date  . Anxiety   . Arthritis   . Asthma   . GERD (gastroesophageal reflux disease)   . Hypertension     Past Surgical History:  Procedure Laterality Date  . ANTERIOR CERVICAL DECOMP/DISCECTOMY FUSION N/A 04/28/2018   Procedure: ANTERIOR CERVICAL DECOMPRESSION/DISCECTOMY FUSION 3 LEVELS-C6-T1;  Surgeon: Meade Maw, MD;  Location: [facility] ORS;  Service: Neurosurgery;  Laterality: N/A;  . BUNIONECTOMY Bilateral   . CARPAL TUNNEL RELEASE    . CARPAL TUNNEL RELEASE Right   . ECTOPIC PREGNANCY SURGERY    . SHOULDER ARTHROSCOPY WITH OPEN ROTATOR CUFF REPAIR Right 08/30/2018   Procedure: SHOULDER ARTHROSCOPY WITH ROTATOR CUFF REPAIR, DISTAL CLAVICLE EXCISION, SUBACROMIAL DECOMPRESSION, SUBPECTORAL BICEPS TENODESIS;  Surgeon: Leim Fabry, MD;  Location: [facility] ORS;  Service: Orthopedics;  Laterality: Right;  . TUBAL LIGATION      Family History  Problem Relation Age of Onset  . Breast cancer Neg Hx     Social History:  reports that she quit smoking about 20 years ago. She has never used smokeless tobacco. She reports current alcohol use. She reports that she does not use drugs.  Allergies  Allergen Reactions  . Neomycin      hives  . Sulfa Antibiotics     Redness   . Benazepril Other (See Comments)    Pt is unaware of reaction type  . Eryc [Erythromycin] Other (See Comments)    Pt is unaware of reaction type  . Flexeril [Cyclobenzaprine] Other (See Comments)    Hallucinate  . Hyzaar [Losartan Potassium-Hctz] Swelling    Medications:  I have reviewed the patient's current medications. Prior to Admission:  Medications Prior to Admission  Medication Sig Dispense Refill Last Dose  . amLODipine (NORVASC) 5 MG tablet Take 5 mg by mouth at bedtime.    09/26/2019 at 2100  . Biotin 10000 MCG TABS Take 10,000 mcg by mouth at bedtime.   09/26/2019 at 2100  . citalopram (CELEXA) 10 MG tablet Take 10 mg by mouth at bedtime.    09/26/2019 at 2100  . estradiol (ESTRACE) 1 MG tablet Take 1 mg by mouth at bedtime.    09/26/2019 at 2100  . loratadine (CLARITIN) 10 MG tablet Take 10 mg by mouth at bedtime as needed for allergies.   Unknown at PRN  . medroxyPROGESTERone (PROVERA) 2.5 MG tablet Take 2.5 mg by mouth at bedtime.    09/26/2019 at 2100  . metoprolol succinate (TOPROL-XL) 25 MG 24 hr tablet Take 25 mg by mouth at bedtime.   09/26/2019 at 2100  . polyvinyl alcohol (LUBRICANT DROPS) 1.4 % ophthalmic solution Place 1 drop into both eyes  4 (four) times daily as needed for dry eyes.   Unknown at PRN  . rosuvastatin (CRESTOR) 10 MG tablet Take 10 mg by mouth at bedtime.  6 09/26/2019 at 2100  . triamterene-hydrochlorothiazide (MAXZIDE-25) 37.5-25 MG tablet Take 1 tablet by mouth at bedtime.    09/26/2019 at 2100  . ondansetron (ZOFRAN ODT) 4 MG disintegrating tablet Take 1 tablet (4 mg total) by mouth every 8 (eight) hours as needed for nausea or vomiting. (Patient not taking: Reported on 09/27/2019) 20 tablet 0 Not Taking at Unknown time  . ondansetron (ZOFRAN) 4 MG tablet Take 1 tablet (4 mg total) by mouth every 6 (six) hours as needed for nausea. (Patient not taking: Reported on 09/27/2019) 20 tablet 0 Not Taking at  Unknown time  . oxyCODONE (OXY IR/ROXICODONE) 5 MG immediate release tablet Take 1-2 tablets (5-10 mg total) by mouth every 3 (three) hours as needed for moderate pain (pain score 4-6). (Patient not taking: Reported on 09/27/2019) 30 tablet 0 Not Taking at Unknown time   Scheduled: . amLODipine  5 mg Oral QHS  . amoxicillin-clavulanate  1 tablet Oral Q12H  . citalopram  10 mg Oral QHS  . enoxaparin (LOVENOX) injection  40 mg Subcutaneous Q24H  . estradiol  1 mg Oral QHS  . meclizine  25 mg Oral TID  . medroxyPROGESTERone  2.5 mg Oral QHS  . pregabalin  25 mg Oral BID  . rosuvastatin  10 mg Oral QHS    ROS: History obtained from the patient  General ROS: negative for - chills, fatigue, fever, night sweats, weight gain or weight loss Psychological ROS: memory difficulties Ophthalmic ROS: negative for - blurry vision, double vision, eye pain or loss of vision ENT ROS: post nasal drip in the mornings, difficulty swallowing Allergy and Immunology ROS: negative for - hives or itchy/watery eyes Hematological and Lymphatic ROS: negative for - bleeding problems, bruising or swollen lymph nodes Endocrine ROS: negative for - galactorrhea, hair pattern changes, polydipsia/polyuria or temperature intolerance Respiratory ROS: negative for - cough, hemoptysis, shortness of breath or wheezing Cardiovascular ROS: negative for - chest pain, dyspnea on exertion, edema or irregular heartbeat Gastrointestinal ROS: negative for - abdominal pain, diarrhea, hematemesis, nausea/vomiting or stool incontinence Genito-Urinary ROS: negative for - dysuria, hematuria, incontinence or urinary frequency/urgency Musculoskeletal ROS: negative for - joint swelling or muscular weakness Neurological ROS: as noted in HPI Dermatological ROS: negative for rash and skin lesion changes  Physical Examination: Blood pressure 125/85, pulse 75, temperature 98.3 F (36.8 C), temperature source Oral, resp. rate 18, height 5' 1"   (1.549 m), weight 72.5 kg, SpO2 100 %.  HEENT-  Normocephalic, no lesions, without obvious abnormality.  Normal external eye and conjunctiva.  Normal TM's bilaterally.  Normal auditory canals and external ears. Normal external nose, mucus membranes and septum.  Normal pharynx. Cardiovascular- S1, S2 normal, pulses palpable throughout   Lungs- chest clear, no wheezing, rales, normal symmetric air entry Abdomen- soft, non-tender; bowel sounds normal; no masses,  no organomegaly Extremities- no edema Lymph-no adenopathy palpable Musculoskeletal-no joint tenderness, deformity or swelling Skin-warm and dry, no hyperpigmentation, vitiligo, or suspicious lesions  Neurological Examination   Mental Status: Alert, oriented, thought content appropriate.  Speech fluent without evidence of aphasia.  Able to follow 3 step commands without difficulty. Cranial Nerves: II: Discs flat bilaterally; Visual fields grossly normal, pupils equal, round, reactive to light and accommodation III,IV, VI: ptosis not present, extra-ocular motions intact bilaterally with exotropia bilaterally V,VII: smile symmetric, facial light  touch sensation normal bilaterally VIII: hearing normal bilaterally IX,X: gag reflex present XI: bilateral shoulder shrug XII: midline tongue extension Motor: Right : Upper extremity   5/5    Left:     Upper extremity   5/5  Lower extremity   5/5     Lower extremity   5/5 Tone and bulk:normal tone throughout; no atrophy noted Sensory: Pinprick and light touch intact throughout, bilaterally Deep Tendon Reflexes: Symmetric throughout Plantars: Right: downgoing   Left: downgoing Cerebellar: Normal finger-to-nose and normal heel-to-shin testing bilaterally Gait: not tested due to safety concerns    Laboratory Studies:   Basic Metabolic Panel: Recent Labs  Lab 09/27/19 1002 09/28/19 0452  NA 138 137  K 3.3* 3.8  CL 101 105  CO2 26 24  GLUCOSE 114* 209*  BUN 17 15  CREATININE 1.03*  0.92  CALCIUM 9.5 9.1    Liver Function Tests: No results for input(s): AST, ALT, ALKPHOS, BILITOT, PROT, ALBUMIN in the last 168 hours. No results for input(s): LIPASE, AMYLASE in the last 168 hours. No results for input(s): AMMONIA in the last 168 hours.  CBC: Recent Labs  Lab 09/27/19 1002 09/28/19 0452 09/29/19 0942  WBC 7.4 12.1* 14.9*  HGB 13.4 12.6 12.0  HCT 40.7 37.0 36.4  MCV 91.3 90.0 91.7  PLT 323 277 259    Cardiac Enzymes: No results for input(s): CKTOTAL, CKMB, CKMBINDEX, TROPONINI in the last 168 hours.  BNP: Invalid input(s): POCBNP  CBG: No results for input(s): GLUCAP in the last 168 hours.  Microbiology: Results for orders placed or performed during the hospital encounter of 09/27/19  SARS CORONAVIRUS 2 (TAT 6-24 HRS) Nasopharyngeal Nasopharyngeal Swab     Status: None   Collection Time: 09/28/19  7:50 AM   Specimen: Nasopharyngeal Swab  Result Value Ref Range Status   SARS Coronavirus 2 NEGATIVE NEGATIVE Final    Comment: (NOTE) SARS-CoV-2 target nucleic acids are NOT DETECTED. The SARS-CoV-2 RNA is generally detectable in upper and lower respiratory specimens during the acute phase of infection. Negative results do not preclude SARS-CoV-2 infection, do not rule out co-infections with other pathogens, and should not be used as the sole basis for treatment or other patient management decisions. Negative results must be combined with clinical observations, patient history, and epidemiological information. The expected result is Negative. Fact Sheet for Patients: SugarRoll.be Fact Sheet for Healthcare Providers: https://www.woods-mathews.com/ This test is not yet approved or cleared by the [place] FDA and  has been authorized for detection and/or diagnosis of SARS-CoV-2 by FDA under an Emergency Use Authorization (EUA). This EUA will remain  in effect (meaning this test can be used) for the duration  of the COVID-19 declaration under Section 56 4(b)(1) of the Act, 21 U.S.C. section 360bbb-3(b)(1), unless the authorization is terminated or revoked sooner. Performed at [facility], [place] [address], [place], [place] [address]     Coagulation Studies: No results for input(s): LABPROT, INR in the last 72 hours.  Urinalysis:  Recent Labs  Lab 09/27/19 1002  COLORURINE YELLOW*  LABSPEC 1.018  PHURINE 5.0  GLUCOSEU NEGATIVE  HGBUR NEGATIVE  BILIRUBINUR NEGATIVE  KETONESUR NEGATIVE  PROTEINUR NEGATIVE  NITRITE NEGATIVE  LEUKOCYTESUR NEGATIVE    Lipid Panel:  No results found for: CHOL, TRIG, HDL, CHOLHDL, VLDL, LDLCALC  HgbA1C: No results found for: HGBA1C  Urine Drug Screen:  No results found for: LABOPIA, COCAINSCRNUR, LABBENZ, AMPHETMU, THCU, LABBARB  Alcohol Level: No results for input(s): ETH in the  last 168 hours.  Other results: EKG: sinus rhythm at 54 bpm.  Imaging: Mr Brain Wo Contrast  Result Date: 09/27/2019 CLINICAL DATA:  Focal neuro deficit, greater than 6 hours, stroke suspected. Additional history provided: Dizziness EXAM: MRI HEAD WITHOUT CONTRAST TECHNIQUE: Multiplanar, multiecho pulse sequences of the brain and surrounding structures were obtained without intravenous contrast. COMPARISON:  Brain MRI 09/20/2019 FINDINGS: Brain: Multiple sequences are motion degraded. This includes moderate motion degradation of the axial acquired diffusion-weighted imaging. The coronal diffusion-weighted imaging is only minimally motion degraded and of good quality. There is no convincing evidence of acute infarct. No evidence of intracranial mass. No midline shift or extra-axial fluid collection. No chronic intracranial blood products. Stable mild scattered T2/FLAIR hyperintensities within the cerebral white matter which are nonspecific but consistent with chronic small vessel ischemic disease. Cerebral volume is normal for age. Vascular: Flow voids maintained within  the proximal large arterial vessels. Skull and upper cervical spine: No focal marrow lesion. Sinuses/Orbits: Unchanged complete opacification of the left maxillary and left frontal sinuses with partial opacification of left ethmoid air cells. Visualized orbits demonstrate no acute abnormality. IMPRESSION: 1. Motion degraded exam. 2. No evidence of acute intracranial abnormality. 3. Stable, mild chronic small vessel ischemic disease. 4. Unchanged sinusitis with complete opacification of the left frontal and left maxillary sinuses. Electronically Signed   By: Kellie Simmering DO   On: 09/27/2019 18:05     Assessment/Plan: 63 year old female presenting with complaints of dizziness.  MRI of the brain reviewed and shows no acute changes but does show frontal and maxillary sinus opacification.  Recent MRI of the brain with contrast performed one week ago with same findings.  Patient also with recent complaints of memory problems.  Patient not orthostatic.  Will do further testing to determine if there may be some other abnormality that may be predisposing patient to both symptoms.    Recommendations: 1. TSH, ESR, CRP, RPR, B12, Angiotensin converting enzyme, lyme titer, A1c 2. Telemetry 3. Frequent neuro checks 4. If above work up unremarkable, patient to follow up with ENT on an outpatient basis.    Alexis Goodell, MD Neurology [phone] 09/29/2019, 1:18 PM

## 2019-09-30 DIAGNOSIS — R42 Dizziness and giddiness: Secondary | ICD-10-CM | POA: Diagnosis not present

## 2019-09-30 LAB — CBC WITH DIFFERENTIAL/PLATELET
Abs Immature Granulocytes: 0.19 10*3/uL — ABNORMAL HIGH (ref 0.00–0.07)
Basophils Absolute: 0 10*3/uL (ref 0.0–0.1)
Basophils Relative: 0 %
Eosinophils Absolute: 0.2 10*3/uL (ref 0.0–0.5)
Eosinophils Relative: 2 %
HCT: 35.9 % — ABNORMAL LOW (ref 36.0–46.0)
Hemoglobin: 11.8 g/dL — ABNORMAL LOW (ref 12.0–15.0)
Immature Granulocytes: 2 %
Lymphocytes Relative: 33 %
Lymphs Abs: 3.6 10*3/uL (ref 0.7–4.0)
MCH: 30.4 pg (ref 26.0–34.0)
MCHC: 32.9 g/dL (ref 30.0–36.0)
MCV: 92.5 fL (ref 80.0–100.0)
Monocytes Absolute: 1.1 10*3/uL — ABNORMAL HIGH (ref 0.1–1.0)
Monocytes Relative: 10 %
Neutro Abs: 5.7 10*3/uL (ref 1.7–7.7)
Neutrophils Relative %: 53 %
Platelets: 254 10*3/uL (ref 150–400)
RBC: 3.88 MIL/uL (ref 3.87–5.11)
RDW: 14 % (ref 11.5–15.5)
WBC: 10.8 10*3/uL — ABNORMAL HIGH (ref 4.0–10.5)
nRBC: 0 % (ref 0.0–0.2)

## 2019-09-30 LAB — RPR
RPR Ser Ql: REACTIVE — AB
RPR Titer: 1:64 {titer}

## 2019-09-30 LAB — ANGIOTENSIN CONVERTING ENZYME: Angiotensin-Converting Enzyme: 27 U/L (ref 14–82)

## 2019-09-30 LAB — B. BURGDORFI ANTIBODIES: B burgdorferi Ab IgG+IgM: <0.91 {ISR} (ref 0.00–0.90)

## 2019-09-30 MED ORDER — ACETAMINOPHEN 325 MG PO TABS: 650.0000 mg | ORAL_TABLET | Freq: Four times a day (QID) | ORAL | Status: AC | PRN

## 2019-09-30 MED ORDER — MECLIZINE HCL 25 MG PO TABS: 25.0000 mg | ORAL_TABLET | Freq: Three times a day (TID) | ORAL | 0 refills | Status: AC | PRN

## 2019-09-30 MED ORDER — AMOXICILLIN-POT CLAVULANATE 875-125 MG PO TABS: 1.0000 | ORAL_TABLET | Freq: Two times a day (BID) | ORAL | 0 refills | Status: AC

## 2019-09-30 NOTE — Progress Notes (Signed)
Patient discharged home per MD order. All discharge instructions given and all questions answered. 

## 2019-09-30 NOTE — TOC Initial Note (Signed)
Transition of Care [facility]) - Initial/Assessment Note    Patient Details  Name: Zoe Fields MRN: [id number] Date of Birth: 03/27/1956  Transition of Care [facility]) CM/SW Contact:    Candie Chroman, LCSW Phone Number: 09/30/2019, 10:06 AM  Clinical Narrative:  CSW met with patient. No supports at bedside. CSW introduced role and explained that PT recommendations would be discussed. Patient is not interested in outpatient PT at this time. Will send list home with her in case she changes her mind and instructed her to on how to get that set up. Patient does not use DME at home. No further concerns. CSW encouraged patient to contact CSW as needed. CSW will continue to follow patient for support and facilitate return home when stable.                Expected Discharge Plan: Home/Self Care Barriers to Discharge: Continued Medical Work up   Patient Goals and CMS Choice        Expected Discharge Plan and Services Expected Discharge Plan: Home/Self Care     Post Acute Care Choice: NA Living arrangements for the past 2 months: Single Family Home                                      Prior Living Arrangements/Services Living arrangements for the past 2 months: Single Family Home Lives with:: Self Patient language and need for interpreter reviewed:: Yes Do you feel safe going back to the place where you live?: Yes      Need for Family Participation in Patient Care: No (Comment) Care giver support system in place?: No (comment)   Criminal Activity/Legal Involvement Pertinent to Current Situation/Hospitalization: No - Comment as needed  Activities of Daily Living Home Assistive Devices/Equipment: None ADL Screening (condition at time of admission) Patient's cognitive ability adequate to safely complete daily activities?: Yes Is the patient deaf or have difficulty hearing?: No Does the patient have difficulty seeing, even when wearing glasses/contacts?: No Does the patient have  difficulty concentrating, remembering, or making decisions?: No Patient able to express need for assistance with ADLs?: Yes Does the patient have difficulty dressing or bathing?: No Independently performs ADLs?: Yes (appropriate for developmental age) Does the patient have difficulty walking or climbing stairs?: No Weakness of Legs: None Weakness of Arms/Hands: None  Permission Sought/Granted                  Emotional Assessment Appearance:: Appears stated age Attitude/Demeanor/Rapport: Engaged, Gracious Affect (typically observed): Accepting, Appropriate, Calm, Pleasant Orientation: : Oriented to Self, Oriented to Place, Oriented to  Time, Oriented to Situation Alcohol / Substance Use: Never Used Psych Involvement: No (comment)  Admission diagnosis:  Dizziness [R42] Vertigo [R42] Acute recurrent maxillary sinusitis [J01.01] Patient Active Problem List   Diagnosis Date Noted  . Vertigo 09/27/2019  . Rotator cuff tear 08/30/2018  . Cervical radiculopathy 04/28/2018   PCP:  Marinda Elk, MD Pharmacy:   [facility] [address] [place] - [place] AT [facility] [address] [place] [address] Phone: [phone] Fax: [phone]     Social Determinants of Health (SDOH) Interventions    Readmission Risk Interventions No flowsheet data found.

## 2019-09-30 NOTE — Discharge Summary (Signed)
[facility] [place] [facility]   PATIENT NAME: Rayaan Lorah    MR#:  [id number]  DATE OF BIRTH:  11-23-1956  DATE OF ADMISSION:  09/27/2019 ADMITTING PHYSICIAN: Milagros Loll, MD  DATE OF DISCHARGE:  09/30/19   PRIMARY CARE PHYSICIAN: Patrice Paradise, MD    ADMISSION DIAGNOSIS:  Dizziness [R42] Vertigo [R42] Acute recurrent maxillary sinusitis [J01.01]  DISCHARGE DIAGNOSIS:  Dizziness  Acute maxillary sinusitis SECONDARY DIAGNOSIS:   Past Medical History:  Diagnosis Date  . Anxiety   . Arthritis   . Asthma   . GERD (gastroesophageal reflux disease)   . Hypertension     HOSPITAL COURSE:  Britini Garcilazo  is a 63 y.o. female with a known history of hypertension, GERD, anxiety presents to the emergency room complaining of acute onset of dizziness with change in position since yesterday night.  This is a room spinning sensation.  No focal weakness.  Patient had some facial droop and memory problems a week back and was seen by neurology as outpatient.  Had MRI of the brain with and without contrast which showed nothing acute.  Mild sinusitis.  Patient symptoms from that episode have resolved.  Today she got meclizine, Decadron, Zofran, Ativan in the emergency room with no improvement in the dizziness and patient is being admitted under observation.  She had a repeat MRI of the brain today after the normal MRI 1 week back.  Today's MRI of the brain showed nothing acute   Dizziness could be from acute sinusitis with horizontal nystagmus -Clinically doing better and okay to discharge patient from neurology standpoint -Patient is not orthostatic -Patient claims meclizine is not helping her, suggested her to take it as needed for vertigo -Physical therapy recommending outpatient PT, case management offered outpatient PT facilities information to the patient patient may or may not consider going -Fall precautions.   -Discussed with patient that she  Will need ENT follow-up after discharge. -MRI of the brain n with complete opacification of the left frontal and left maxillary sinuses -Patient is started on Augmentin p.o. as well blood cell count is elevated, leukocytosis is trending down now -Urinalysis is normal -Seen by neurology Dr. Thad Ranger who has ordered sed rate, CRP, B12 level, A1c and ACE are normal.  TSH is elevated which needs further investigation by primary care physician with free T4 and T3  Lyme titer is pending primary care physician to follow-up -Frequent neuro checks done during the hospital course and patient feels much better -Outpatient follow-up with the balance center recommended  *Elevated TSH-PCP to check free T4 and T3 during the follow-up visit   *Hypertension. Continue home medications Norvasc.  Discontinued Maxide and metoprolol in view of dizziness and low blood pressure  *Anxiety. Continue home medications  DVT prophylaxis with Lovenox DISCHARGE CONDITIONS:  fair  CONSULTS OBTAINED:  Treatment Team:  Thana Farr, MD   PROCEDURES  None   DRUG ALLERGIES:   Allergies  Allergen Reactions  . Neomycin     hives  . Sulfa Antibiotics     Redness   . Benazepril Other (See Comments)    Pt is unaware of reaction type  . Eryc [Erythromycin] Other (See Comments)    Pt is unaware of reaction type  . Flexeril [Cyclobenzaprine] Other (See Comments)    Hallucinate  . Hyzaar [Losartan Potassium-Hctz] Swelling    DISCHARGE MEDICATIONS:   Allergies as of 09/30/2019      Reactions   Neomycin    hives  Sulfa Antibiotics    Redness    Benazepril Other (See Comments)   Pt is unaware of reaction type   Eryc [erythromycin] Other (See Comments)   Pt is unaware of reaction type   Flexeril [cyclobenzaprine] Other (See Comments)   Hallucinate   Hyzaar [losartan Potassium-hctz] Swelling      Medication List    STOP taking these medications   metoprolol succinate 25 MG 24 hr  tablet Commonly known as: TOPROL-XL   ondansetron 4 MG disintegrating tablet Commonly known as: Zofran ODT   ondansetron 4 MG tablet Commonly known as: ZOFRAN   oxyCODONE 5 MG immediate release tablet Commonly known as: Oxy IR/ROXICODONE   triamterene-hydrochlorothiazide 37.5-25 MG tablet Commonly known as: MAXZIDE-25     TAKE these medications   acetaminophen 325 MG tablet Commonly known as: TYLENOL Take 2 tablets (650 mg total) by mouth every 6 (six) hours as needed for mild pain (or Fever >/= 101).   amLODipine 5 MG tablet Commonly known as: NORVASC Take 5 mg by mouth at bedtime.   amoxicillin-clavulanate 875-125 MG tablet Commonly known as: AUGMENTIN Take 1 tablet by mouth every 12 (twelve) hours.   Biotin 10000 MCG Tabs Take 10,000 mcg by mouth at bedtime.   citalopram 10 MG tablet Commonly known as: CELEXA Take 10 mg by mouth at bedtime.   estradiol 1 MG tablet Commonly known as: ESTRACE Take 1 mg by mouth at bedtime.   loratadine 10 MG tablet Commonly known as: CLARITIN Take 10 mg by mouth at bedtime as needed for allergies.   Lubricant Drops 1.4 % ophthalmic solution Generic drug: polyvinyl alcohol Place 1 drop into both eyes 4 (four) times daily as needed for dry eyes.   meclizine 25 MG tablet Commonly known as: ANTIVERT Take 1 tablet (25 mg total) by mouth 3 (three) times daily as needed for dizziness.   medroxyPROGESTERone 2.5 MG tablet Commonly known as: PROVERA Take 2.5 mg by mouth at bedtime.   rosuvastatin 10 MG tablet Commonly known as: CRESTOR Take 10 mg by mouth at bedtime.        DISCHARGE INSTRUCTIONS:   Follow-up with primary care physician in 3 days Follow-up with neurology Dr. Melrose Nakayama in 1 month Follow-up with ENT Dr Pryor Ochoa in 1 week Follow-up with outpatient balance center-in 3 to 5 days Outpatient physical therapy  DIET:  Cardiac diet  DISCHARGE CONDITION:  Fair  ACTIVITY:  Activity as tolerated  OXYGEN:  Home  Oxygen: No.   Oxygen Delivery: room air  DISCHARGE LOCATION:  home   If you experience worsening of your admission symptoms, develop shortness of breath, life threatening emergency, suicidal or homicidal thoughts you must seek medical attention immediately by calling 911 or calling your MD immediately  if symptoms less severe.  You Must read complete instructions/literature along with all the possible adverse reactions/side effects for all the Medicines you take and that have been prescribed to you. Take any new Medicines after you have completely understood and accpet all the possible adverse reactions/side effects.   Please note  You were cared for by a hospitalist during your hospital stay. If you have any questions about your discharge medications or the care you received while you were in the hospital after you are discharged, you can call the unit and asked to speak with the hospitalist on call if the hospitalist that took care of you is not available. Once you are discharged, your primary care physician will handle any further medical issues. Please note  that NO REFILLS for any discharge medications will be authorized once you are discharged, as it is imperative that you return to your primary care physician (or establish a relationship with a primary care physician if you do not have one) for your aftercare needs so that they can reassess your need for medications and monitor your lab values.     Today  Chief Complaint  Patient presents with  . Dizziness   Patient feels much better dizziness significantly improved white blood cell count is trending down no fever wants to go home okay to discharge patient from neurology standpoint  ROS:  CONSTITUTIONAL: Denies fevers, chills. Denies any fatigue, weakness.  EYES: Denies blurry vision, double vision, eye pain. EARS, NOSE, THROAT: Denies tinnitus, ear pain, hearing loss. RESPIRATORY: Denies cough, wheeze, shortness of breath.   CARDIOVASCULAR: Denies chest pain, palpitations, edema.  GASTROINTESTINAL: Denies nausea, vomiting, diarrhea, abdominal pain. Denies bright red blood per rectum. GENITOURINARY: Denies dysuria, hematuria. ENDOCRINE: Denies nocturia or thyroid problems. HEMATOLOGIC AND LYMPHATIC: Denies easy bruising or bleeding. SKIN: Denies rash or lesion. MUSCULOSKELETAL: Denies pain in neck, back, shoulder, knees, hips or arthritic symptoms.  NEUROLOGIC: Denies paralysis, paresthesias.  PSYCHIATRIC: Denies anxiety or depressive symptoms.   VITAL SIGNS:  Blood pressure 103/79, pulse (!) 55, temperature 98.4 F (36.9 C), temperature source Oral, resp. rate 20, height  (1.549 m), weight 72.5 kg, SpO2 100 %.  I/O:    Intake/Output Summary (Last 24 hours) at 09/30/2019 1137 Last data filed at 09/30/2019 1023 Gross per 24 hour  Intake 720 ml  Output -  Net 720 ml    PHYSICAL EXAMINATION:  GENERAL:  63 y.o.-year-old patient lying in the bed with no acute distress.  EYES: Pupils equal, round, reactive to light and accommodation. No scleral icterus. Extraocular muscles intact.  HEENT: Head atraumatic, normocephalic. Oropharynx and nasopharynx clear.  NECK:  Supple, no jugular venous distention. No thyroid enlargement, no tenderness.  LUNGS: Normal breath sounds bilaterally, no wheezing, rales,rhonchi or crepitation. No use of accessory muscles of respiration.  CARDIOVASCULAR: S1, S2 normal. No murmurs, rubs, or gallops.  ABDOMEN: Soft, non-tender, non-distended. Bowel sounds present. EXTREMITIES: No pedal edema, cyanosis, or clubbing.  NEUROLOGIC: Cranial nerves II through XII are intact. Muscle strength 5/5 in all extremities. Sensation intact. Gait not checked.  Positive horizontal nystagmus PSYCHIATRIC: The patient is alert and oriented x 3.  SKIN: No obvious rash, lesion, or ulcer.   DATA REVIEW:   CBC Recent Labs  Lab 09/30/19 0527  WBC 10.8*  HGB 11.8*  HCT 35.9*  PLT 254     Chemistries  Recent Labs  Lab 09/28/19 0452  NA 137  K 3.8  CL 105  CO2 24  GLUCOSE 209*  BUN 15  CREATININE 0.92  CALCIUM 9.1    Cardiac Enzymes No results for input(s): TROPONINI in the last 168 hours.  Microbiology Results  Results for orders placed or performed during the hospital encounter of 09/27/19  SARS CORONAVIRUS 2 (TAT 6-24 HRS) Nasopharyngeal Nasopharyngeal Swab     Status: None   Collection Time: 09/28/19  7:50 AM   Specimen: Nasopharyngeal Swab  Result Value Ref Range Status   SARS Coronavirus 2 NEGATIVE NEGATIVE Final    Comment: (NOTE) SARS-CoV-2 target nucleic acids are NOT DETECTED. The SARS-CoV-2 RNA is generally detectable in upper and lower respiratory specimens during the acute phase of infection. Negative results do not preclude SARS-CoV-2 infection, do not rule out co-infections with other pathogens, and should not be  used as the sole basis for treatment or other patient management decisions. Negative results must be combined with clinical observations, patient history, and epidemiological information. The expected result is Negative. Fact Sheet for Patients: HairSlick.nohttps://www.fda.gov/media/138098/download Fact Sheet for Healthcare Providers: quierodirigir.comhttps://www.fda.gov/media/138095/download This test is not yet approved or cleared by the [place] FDA and  has been authorized for detection and/or diagnosis of SARS-CoV-2 by FDA under an Emergency Use Authorization (EUA). This EUA will remain  in effect (meaning this test can be used) for the duration of the COVID-19 declaration under Section 56 4(b)(1) of the Act, 21 U.S.C. section 360bbb-3(b)(1), unless the authorization is terminated or revoked sooner. Performed at [facility], [address] [place], [place] [address]     RADIOLOGY:  Mr Sherrin DaisyBrain Wo Contrast  Result Date: 09/27/2019 CLINICAL DATA:  Focal neuro deficit, greater than 6 hours, stroke suspected. Additional history  provided: Dizziness EXAM: MRI HEAD WITHOUT CONTRAST TECHNIQUE: Multiplanar, multiecho pulse sequences of the brain and surrounding structures were obtained without intravenous contrast. COMPARISON:  Brain MRI 09/20/2019 FINDINGS: Brain: Multiple sequences are motion degraded. This includes moderate motion degradation of the axial acquired diffusion-weighted imaging. The coronal diffusion-weighted imaging is only minimally motion degraded and of good quality. There is no convincing evidence of acute infarct. No evidence of intracranial mass. No midline shift or extra-axial fluid collection. No chronic intracranial blood products. Stable mild scattered T2/FLAIR hyperintensities within the cerebral white matter which are nonspecific but consistent with chronic small vessel ischemic disease. Cerebral volume is normal for age. Vascular: Flow voids maintained within the proximal large arterial vessels. Skull and upper cervical spine: No focal marrow lesion. Sinuses/Orbits: Unchanged complete opacification of the left maxillary and left frontal sinuses with partial opacification of left ethmoid air cells. Visualized orbits demonstrate no acute abnormality. IMPRESSION: 1. Motion degraded exam. 2. No evidence of acute intracranial abnormality. 3. Stable, mild chronic small vessel ischemic disease. 4. Unchanged sinusitis with complete opacification of the left frontal and left maxillary sinuses. Electronically Signed   By: Jackey LogeKyle  Golden DO   On: 09/27/2019 18:05    EKG:   Orders placed or performed during the hospital encounter of 09/27/19  . ED EKG  . ED EKG  . EKG 12-Lead  . EKG 12-Lead  . EKG 12-Lead  . EKG 12-Lead      Management plans discussed with the patient, she is  in agreement.  CODE STATUS:     Code Status Orders  (From admission, onward)         Start     Ordered   09/27/19 1838  Full code  Continuous     09/27/19 1839        Code Status History    Date Active Date Inactive Code  Status Order ID Comments User Context   08/30/2018 2001 08/31/2018 2040 Full Code [id number]  Signa KellPatel, Sunny, MD Inpatient   04/28/2018 1100 04/29/2018 2124 Full Code [id number]  Ivar DrapeFerri, Amanda, PA-C Inpatient   Advance Care Planning Activity      TOTAL TIME TAKING CARE OF THIS PATIENT: 45 minutes.   Note: This dictation was prepared with Dragon dictation along with smaller phrase technology. Any transcriptional errors that result from this process are unintentional.   @[facility] @  on 09/30/2019 at 11:37 AM  Between 7am to 6pm - Pager - [phone]  After 6pm go to [email or web address] - password [email or web address] [facility]  [facility]  Office  [phone]  CC: Primary care physician; Patrice ParadiseMcLaughlin, Miriam K, MD

## 2019-09-30 NOTE — TOC Transition Note (Signed)
Transition of Care [facility]) - CM/SW Discharge Note   Patient Details  Name: EMILYANN BANKA MRN: [id number] Date of Birth: 1956/09/11  [facility]) CM/SW Contact:  Candie Chroman, LCSW Phone Number: 09/30/2019, 11:38 AM   Clinical Narrative: Patient has orders to discharge home today. No further concerns. CSW signing off.    Final next level of care: Home/Self Care Barriers to Discharge: Barriers Resolved   Patient Goals and CMS Choice        Discharge Placement                    Patient and family notified of of transfer: 09/30/19  Discharge Plan and Services     Post Acute Care Choice: NA                               Social Determinants of Health (SDOH) Interventions     Readmission Risk Interventions No flowsheet data found.

## 2019-09-30 NOTE — Progress Notes (Signed)
Subjective: Patient still reports feeling a little dizzy but improved.    Objective: Current vital signs: BP 103/79 (BP Location: Right Arm)   Pulse (!) 55   Temp 98.4 F (36.9 C) (Oral)   Resp 20   Ht _0  (1.549 m)   Wt 72.5 kg   SpO2 100%   BMI 30.19 kg/m  Vital signs in last 24 hours: Temp:  [98.2 F (36.8 C)-98.5 F (36.9 C)] 98.4 F (36.9 C) (10/30 0524) Pulse Rate:  [55-91] 55 (10/30 0524) Resp:  [17-20] 20 (10/30 0524) BP: (103-125)/(64-85) 103/79 (10/30 0524) SpO2:  [98 %-100 %] 100 % (10/30 0524)  Intake/Output from previous day: 10/29 0701 - 10/30 0700 In: 600 [P.O.:600] Out: -  Intake/Output this shift: No intake/output data recorded. Nutritional status:  Diet Order            Diet regular Room service appropriate? Yes; Fluid consistency: Thin  Diet effective now              Neurologic Exam: Mental Status: Alert, oriented, thought content appropriate.  Speech fluent without evidence of aphasia.  Able to follow 3 step commands without difficulty. Cranial Nerves: II: Discs flat bilaterally; Visual fields grossly normal, pupils equal, round, reactive to light and accommodation III,IV, VI: ptosis not present, extra-ocular motions intact bilaterally with exotropia bilaterally V,VII: smile symmetric, facial light touch sensation normal bilaterally VIII: hearing normal bilaterally IX,X: gag reflex present XI: bilateral shoulder shrug XII: midline tongue extension Motor: 5/5 throughout Sensory: Pinprick and light touch intact throughout, bilaterally   Lab Results: Basic Metabolic Panel: Recent Labs  Lab 09/27/19 1002 09/28/19 0452  NA 138 137  K 3.3* 3.8  CL 101 105  CO2 26 24  GLUCOSE 114* 209*  BUN 17 15  CREATININE 1.03* 0.92  CALCIUM 9.5 9.1    Liver Function Tests: No results for input(s): AST, ALT, ALKPHOS, BILITOT, PROT, ALBUMIN in the last 168 hours. No results for input(s): LIPASE, AMYLASE in the last 168 hours. No results for  input(s): AMMONIA in the last 168 hours.  CBC: Recent Labs  Lab 09/27/19 1002 09/28/19 0452 09/29/19 0942 09/30/19 0527  WBC 7.4 12.1* 14.9* 10.8*  NEUTROABS  --   --   --  5.7  HGB 13.4 12.6 12.0 11.8*  HCT 40.7 37.0 36.4 35.9*  MCV 91.3 90.0 91.7 92.5  PLT 323 277 259 254    Cardiac Enzymes: No results for input(s): CKTOTAL, CKMB, CKMBINDEX, TROPONINI in the last 168 hours.  Lipid Panel: No results for input(s): CHOL, TRIG, HDL, CHOLHDL, VLDL, LDLCALC in the last 168 hours.  CBG: No results for input(s): GLUCAP in the last 168 hours.  Microbiology: Results for orders placed or performed during the hospital encounter of 09/27/19  SARS CORONAVIRUS 2 (TAT 6-24 HRS) Nasopharyngeal Nasopharyngeal Swab     Status: None   Collection Time: 09/28/19  7:50 AM   Specimen: Nasopharyngeal Swab  Result Value Ref Range Status   SARS Coronavirus 2 NEGATIVE NEGATIVE Final    Comment: (NOTE) SARS-CoV-2 target nucleic acids are NOT DETECTED. The SARS-CoV-2 RNA is generally detectable in upper and lower respiratory specimens during the acute phase of infection. Negative results do not preclude SARS-CoV-2 infection, do not rule out co-infections with other pathogens, and should not be used as the sole basis for treatment or other patient management decisions. Negative results must be combined with clinical observations, patient history, and epidemiological information. The expected result is Negative. Fact Sheet for Patients:  SugarRoll.be Fact Sheet for Healthcare Providers: https://www.woods-mathews.com/ This test is not yet approved or cleared by the [place] FDA and  has been authorized for detection and/or diagnosis of SARS-CoV-2 by FDA under an Emergency Use Authorization (EUA). This EUA will remain  in effect (meaning this test can be used) for the duration of the COVID-19 declaration under Section 56 4(b)(1) of the Act, 21  U.S.C. section 360bbb-3(b)(1), unless the authorization is terminated or revoked sooner. Performed at [facility], [place] [address], [place], [place] [address]     Coagulation Studies: No results for input(s): LABPROT, INR in the last 72 hours.  Imaging: No results found.  Medications:  I have reviewed the patient's current medications. Scheduled: . amLODipine  5 mg Oral QHS  . amoxicillin-clavulanate  1 tablet Oral Q12H  . citalopram  10 mg Oral QHS  . enoxaparin (LOVENOX) injection  40 mg Subcutaneous Q24H  . estradiol  1 mg Oral QHS  . meclizine  25 mg Oral TID  . medroxyPROGESTERone  2.5 mg Oral QHS  . pregabalin  25 mg Oral BID  . rosuvastatin  10 mg Oral QHS    Assessment/Plan: 63 year old female presenting with complaints of dizziness. MRI of the brain reviewed and shows no acute changes but does show frontal and maxillary sinus opacification.  Recent MRI of the brain with contrast performed one week ago with same findings.  Patient also with recent complaints of memory problems.  Patient not orthostatic. B12, CRP, A1c, ESR, ACE are normal.  TSH is elevated.  Recommendations: 1. Free T4.  Remaining lab work pending.       LOS: 0 days   Zoe Goodell, MD Neurology [phone] 09/30/2019  10:07 AM

## 2019-10-03 ENCOUNTER — Other Ambulatory Visit: Payer: Self-pay

## 2019-10-03 ENCOUNTER — Ambulatory Visit
Admission: RE | Admit: 2019-10-03 | Discharge: 2019-10-03 | Disposition: A | Discharge status: Home / Self Care | Payer: Managed Care, Other (non HMO) | Source: Ambulatory Visit | Attending: Physician Assistant | Admitting: Physician Assistant

## 2019-10-03 DIAGNOSIS — Z1231 Encounter for screening mammogram for malignant neoplasm of breast: Secondary | ICD-10-CM | POA: Diagnosis present

## 2019-10-03 LAB — T.PALLIDUM AB, TOTAL: T Pallidum Abs: NONREACTIVE

## 2020-01-09 ENCOUNTER — Ambulatory Visit: Payer: Self-pay | Attending: Internal Medicine

## 2020-01-09 DIAGNOSIS — Z23 Encounter for immunization: Secondary | ICD-10-CM | POA: Insufficient documentation

## 2020-01-09 NOTE — Progress Notes (Signed)
   Covid-19 Vaccination Clinic  Name:  LILLYAUNA JENKINSON    MRN: [id number] DOB: Nov 17, 1956  01/09/2020  Ms. Petras was observed post Covid-19 immunization for 15 minutes without incidence. She was provided with Vaccine Information Sheet and instruction to access the V-Safe system.   Ms. Kissler was instructed to call 911 with any severe reactions post vaccine: Marland Kitchen Difficulty breathing  . Swelling of your face and throat  . A fast heartbeat  . A bad rash all over your body  . Dizziness and weakness    Immunizations Administered    Name Date Dose VIS Date Route   Moderna COVID-19 Vaccine 01/09/2020  3:03 PM 0.5 mL 11/01/2019 Intramuscular   Manufacturer: Moderna   Lot: [id number]   NDC: 79892-119-41

## 2020-02-08 ENCOUNTER — Ambulatory Visit: Payer: Self-pay | Attending: Internal Medicine

## 2020-02-08 DIAGNOSIS — Z23 Encounter for immunization: Secondary | ICD-10-CM | POA: Insufficient documentation

## 2020-02-08 NOTE — Progress Notes (Signed)
   Covid-19 Vaccination Clinic  Name:  RHEALYNN MYHRE    MRN: [id number] DOB: 1956/04/26  02/08/2020  Ms. Guastella was observed post Covid-19 immunization for 15 minutes without incident. She was provided with Vaccine Information Sheet and instruction to access the V-Safe system.   Ms. Goldblatt was instructed to call 911 with any severe reactions post vaccine: Marland Kitchen Difficulty breathing  . Swelling of face and throat  . A fast heartbeat  . A bad rash all over body  . Dizziness and weakness   Immunizations Administered    Name Date Dose VIS Date Route   Moderna COVID-19 Vaccine 02/08/2020  9:31 AM 0.5 mL 11/01/2019 Intramuscular   Manufacturer: Moderna   Lot: [id number]   NDC: 76151-834-37

## 2021-04-24 ENCOUNTER — Other Ambulatory Visit: Payer: Self-pay | Admitting: Physician Assistant

## 2021-04-24 DIAGNOSIS — Z1231 Encounter for screening mammogram for malignant neoplasm of breast: Secondary | ICD-10-CM

## 2021-05-24 ENCOUNTER — Other Ambulatory Visit: Payer: Self-pay

## 2021-12-09 ENCOUNTER — Other Ambulatory Visit: Payer: Self-pay | Admitting: Family Medicine

## 2021-12-09 DIAGNOSIS — M5416 Radiculopathy, lumbar region: Secondary | ICD-10-CM

## 2021-12-18 ENCOUNTER — Ambulatory Visit
Admission: RE | Admit: 2021-12-18 | Discharge: 2021-12-18 | Disposition: A | Discharge status: Home / Self Care | Payer: Medicare HMO | Source: Ambulatory Visit | Attending: Family Medicine | Admitting: Family Medicine

## 2021-12-18 ENCOUNTER — Other Ambulatory Visit: Payer: Self-pay

## 2021-12-18 DIAGNOSIS — M5416 Radiculopathy, lumbar region: Secondary | ICD-10-CM | POA: Diagnosis not present

## 2022-04-11 ENCOUNTER — Other Ambulatory Visit: Payer: Self-pay | Admitting: Physician Assistant

## 2022-04-11 DIAGNOSIS — Z1231 Encounter for screening mammogram for malignant neoplasm of breast: Secondary | ICD-10-CM

## 2022-05-15 ENCOUNTER — Ambulatory Visit
Admission: RE | Admit: 2022-05-15 | Discharge: 2022-05-15 | Disposition: A | Discharge status: Home / Self Care | Payer: Medicare HMO | Source: Ambulatory Visit | Attending: Physician Assistant | Admitting: Physician Assistant

## 2022-05-15 DIAGNOSIS — Z1231 Encounter for screening mammogram for malignant neoplasm of breast: Secondary | ICD-10-CM | POA: Diagnosis not present

## 2022-10-25 IMAGING — MG MM DIGITAL SCREENING BILAT W/ TOMO AND CAD
6 of 10 series · 6 of 30 positions shown · non-contrast
Comparison: Previous exam(s).

CLINICAL DATA: Screening.

EXAM:
DIGITAL SCREENING BILATERAL MAMMOGRAM WITH TOMOSYNTHESIS AND CAD
TECHNIQUE: Bilateral screening digital craniocaudal and mediolateral oblique
mammograms were obtained. Bilateral screening digital breast
tomosynthesis was performed. The images were evaluated with
computer-aided detection.

[R MLO synth-2D (1 of 2)]
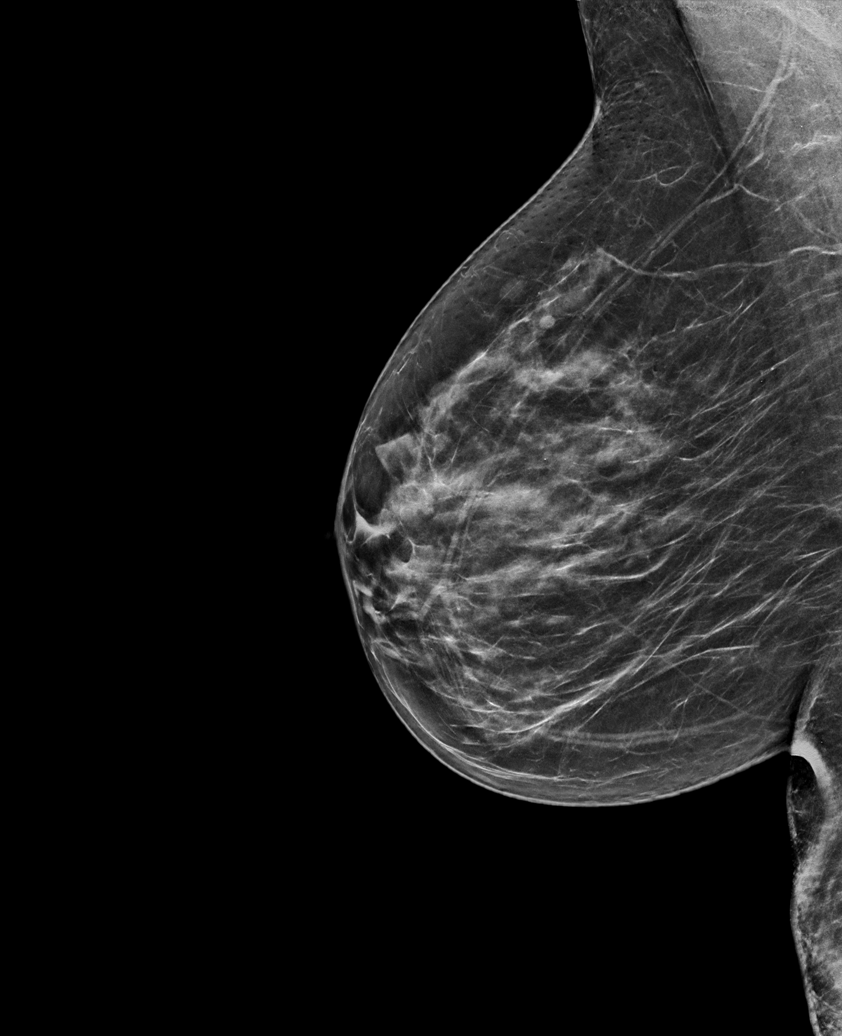

[L CC synth-2D]
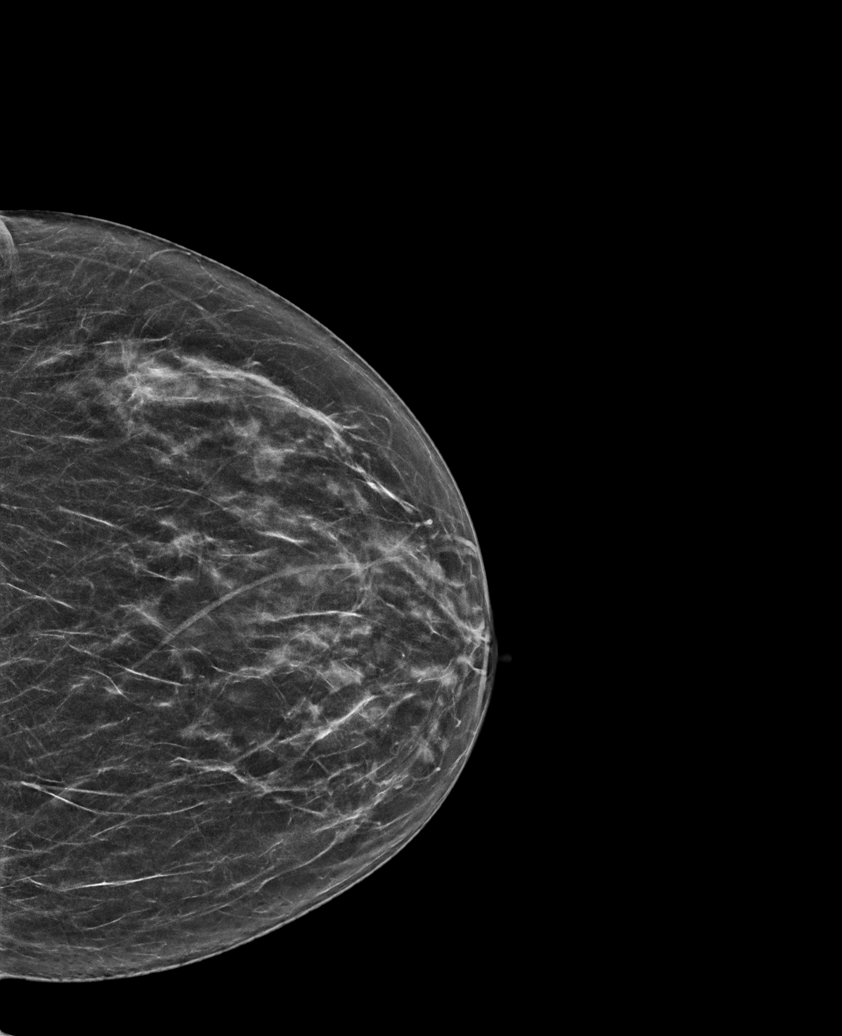

[L MLO synth-2D]
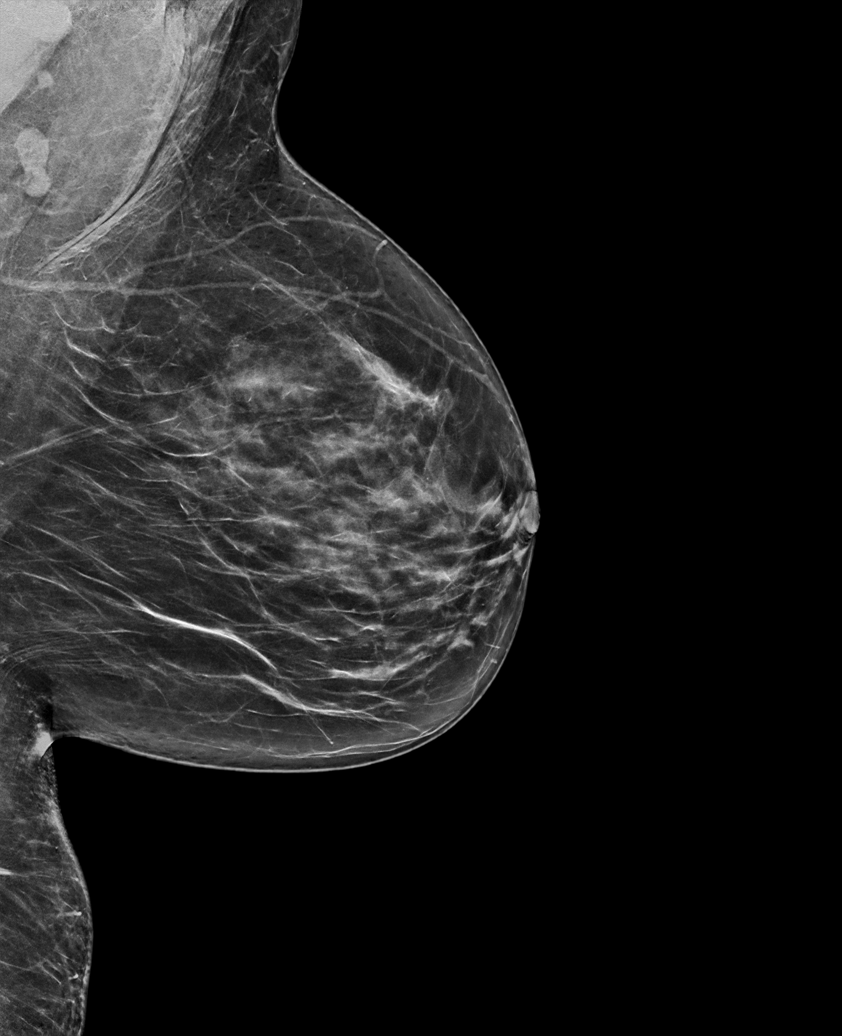

[R MLO synth-2D (2 of 2)]
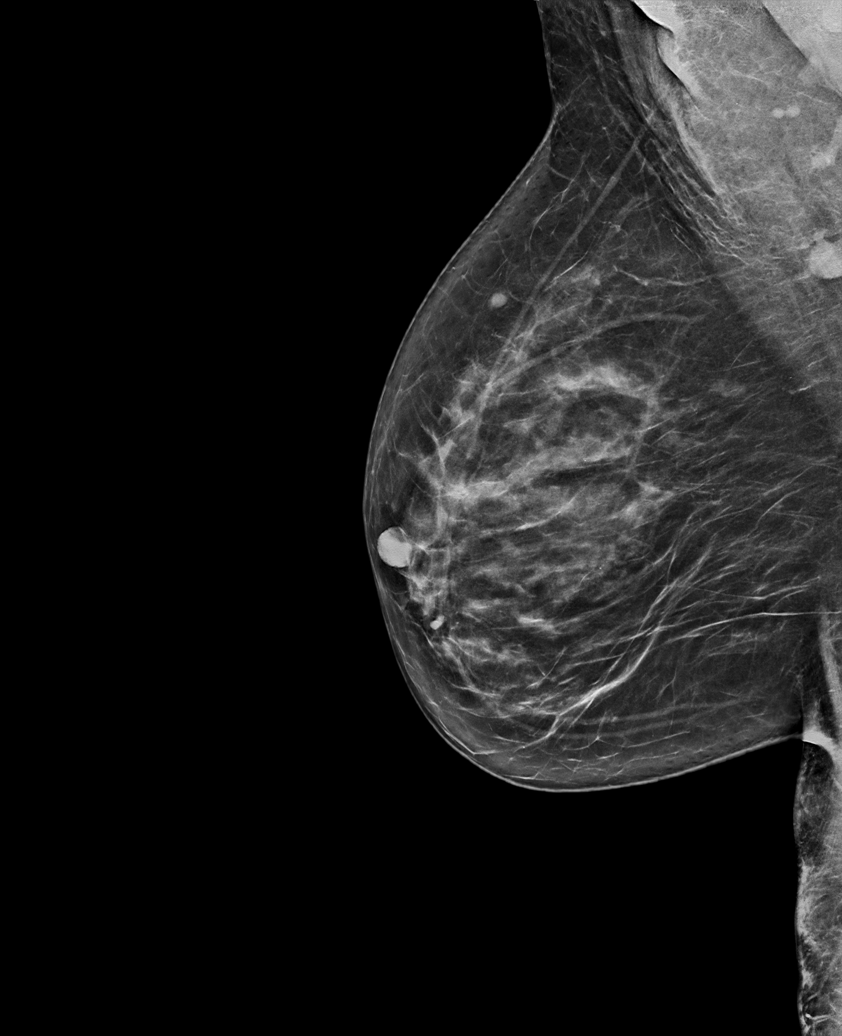

[R CC synth-2D]
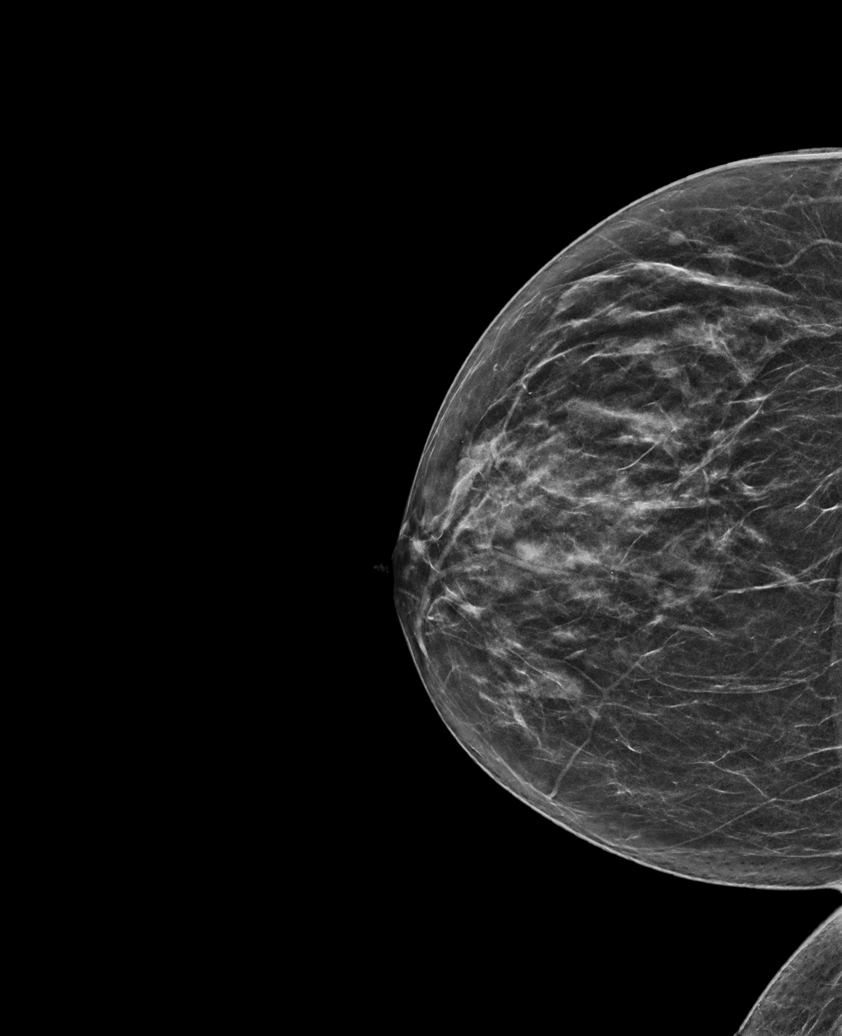

[R CC tomo · tomo slice 31/62.0]
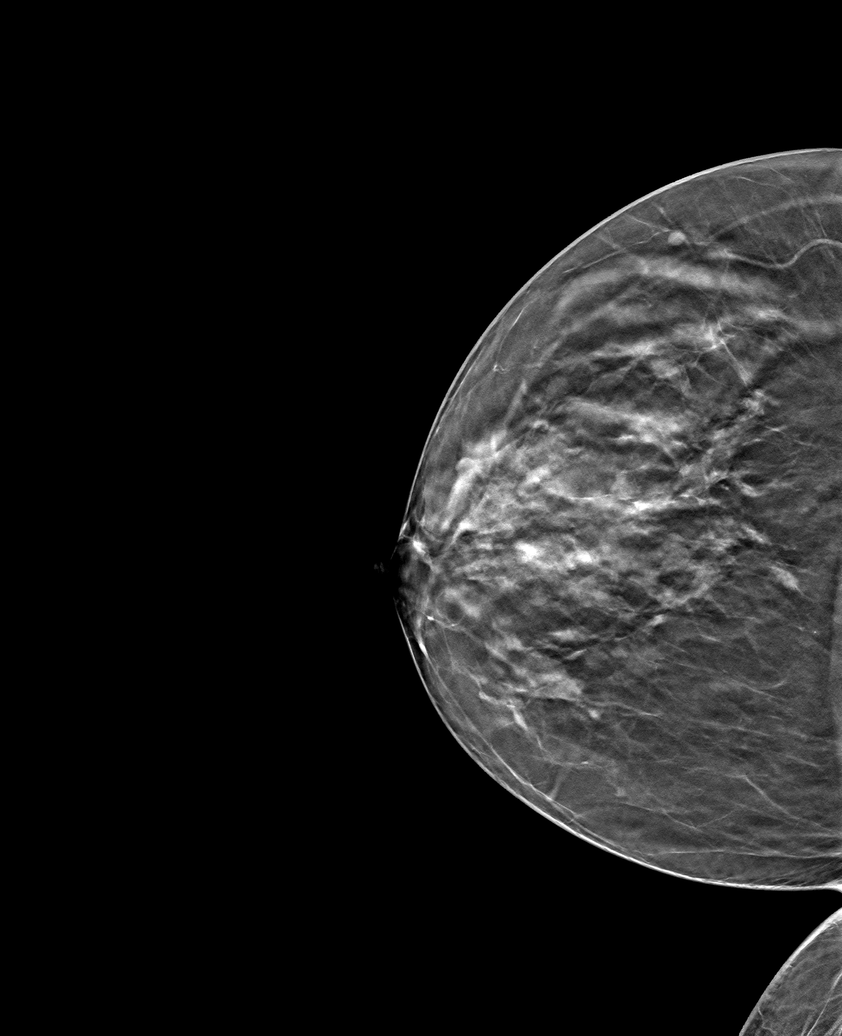

[6 of 30 positions shown; findings below may reference images not displayed]

ACR Breast Density Category c: The breast tissue is heterogeneously
dense, which may obscure small masses.
FINDINGS: There are no findings suspicious for malignancy.
IMPRESSION: No mammographic evidence of malignancy. A result letter of this
screening mammogram will be mailed directly to the patient.

RECOMMENDATION:
Screening mammogram in one year. (Code:Q3-W-BC3)

BI-RADS CATEGORY  1: Negative.

## 2023-05-06 ENCOUNTER — Other Ambulatory Visit: Payer: Self-pay | Admitting: Physician Assistant

## 2023-05-06 DIAGNOSIS — Z1231 Encounter for screening mammogram for malignant neoplasm of breast: Secondary | ICD-10-CM

## 2023-09-03 DIAGNOSIS — Z23 Encounter for immunization: Secondary | ICD-10-CM | POA: Diagnosis not present

## 2023-09-24 ENCOUNTER — Other Ambulatory Visit: Payer: Self-pay | Admitting: Physician Assistant

## 2023-09-24 DIAGNOSIS — T50905A Adverse effect of unspecified drugs, medicaments and biological substances, initial encounter: Secondary | ICD-10-CM

## 2023-09-24 DIAGNOSIS — Z1231 Encounter for screening mammogram for malignant neoplasm of breast: Secondary | ICD-10-CM

## 2023-09-28 ENCOUNTER — Other Ambulatory Visit: Payer: Self-pay | Admitting: Physician Assistant

## 2023-09-28 DIAGNOSIS — N6489 Other specified disorders of breast: Secondary | ICD-10-CM

## 2023-10-21 ENCOUNTER — Ambulatory Visit
Admission: RE | Admit: 2023-10-21 | Discharge: 2023-10-21 | Disposition: A | Discharge status: Home / Self Care | Payer: Medicare HMO | Source: Ambulatory Visit | Attending: Physician Assistant | Admitting: Physician Assistant

## 2023-10-21 DIAGNOSIS — N6489 Other specified disorders of breast: Secondary | ICD-10-CM | POA: Insufficient documentation

## 2024-05-16 ENCOUNTER — Encounter: Payer: Self-pay | Admitting: Physician Assistant

## 2024-05-16 ENCOUNTER — Other Ambulatory Visit: Payer: Self-pay | Admitting: Physician Assistant

## 2024-05-16 DIAGNOSIS — N6312 Unspecified lump in the right breast, upper inner quadrant: Secondary | ICD-10-CM

## 2024-05-16 DIAGNOSIS — Z1231 Encounter for screening mammogram for malignant neoplasm of breast: Secondary | ICD-10-CM

## 2024-05-17 ENCOUNTER — Encounter: Payer: Self-pay | Admitting: Physician Assistant

## 2024-05-18 ENCOUNTER — Other Ambulatory Visit: Payer: Self-pay | Admitting: Physician Assistant

## 2024-05-18 DIAGNOSIS — N63 Unspecified lump in unspecified breast: Secondary | ICD-10-CM

## 2024-05-25 ENCOUNTER — Telehealth: Payer: Self-pay

## 2024-05-25 ENCOUNTER — Ambulatory Visit
Admission: RE | Admit: 2024-05-25 | Discharge: 2024-05-25 | Disposition: A | Discharge status: Home / Self Care | Source: Ambulatory Visit | Attending: Physician Assistant | Admitting: Physician Assistant

## 2024-05-25 ENCOUNTER — Ambulatory Visit
Admission: RE | Admit: 2024-05-25 | Discharge: 2024-05-25 | Discharge status: Home / Self Care | Source: Ambulatory Visit | Attending: Physician Assistant | Admitting: Physician Assistant

## 2024-05-25 DIAGNOSIS — N63 Unspecified lump in unspecified breast: Secondary | ICD-10-CM

## 2024-05-25 DIAGNOSIS — N6314 Unspecified lump in the right breast, lower inner quadrant: Secondary | ICD-10-CM | POA: Diagnosis not present

## 2024-05-25 DIAGNOSIS — N6315 Unspecified lump in the right breast, overlapping quadrants: Secondary | ICD-10-CM | POA: Insufficient documentation

## 2024-05-25 NOTE — Telephone Encounter (Signed)
 The patient walked into the clinic inquiring about the status of her referral, as she is trying to schedule a colonoscopy. I informed her that we do not currently have a referral on file in our system.  I asked if she was experiencing any GI symptoms, and she stated that she is not. She explained that the colonoscopy is being done as a routine screening required by Medicare and not due to any GI issues.  I provided her with our fax number [phone]) and phone number [phone]), along with my name, and advised her to have her PCP send the referral directly to us .

## 2024-09-15 ENCOUNTER — Ambulatory Visit
Admission: RE | Admit: 2024-09-15 | Discharge: 2024-09-15 | Disposition: A | Discharge status: Home / Self Care | Attending: Gastroenterology | Admitting: Gastroenterology

## 2024-09-15 ENCOUNTER — Ambulatory Visit: Admitting: Anesthesiology

## 2024-09-15 ENCOUNTER — Encounter
Admission: RE | Disposition: A | Discharge status: Home / Self Care | Payer: Self-pay | Source: Home / Self Care | Attending: Gastroenterology

## 2024-09-15 ENCOUNTER — Encounter: Payer: Self-pay | Admitting: Gastroenterology

## 2024-09-15 DIAGNOSIS — K219 Gastro-esophageal reflux disease without esophagitis: Secondary | ICD-10-CM | POA: Insufficient documentation

## 2024-09-15 DIAGNOSIS — J45909 Unspecified asthma, uncomplicated: Secondary | ICD-10-CM | POA: Insufficient documentation

## 2024-09-15 DIAGNOSIS — Z79899 Other long term (current) drug therapy: Secondary | ICD-10-CM | POA: Diagnosis not present

## 2024-09-15 DIAGNOSIS — I10 Essential (primary) hypertension: Secondary | ICD-10-CM | POA: Insufficient documentation

## 2024-09-15 DIAGNOSIS — K5730 Diverticulosis of large intestine without perforation or abscess without bleeding: Secondary | ICD-10-CM | POA: Insufficient documentation

## 2024-09-15 DIAGNOSIS — Z87891 Personal history of nicotine dependence: Secondary | ICD-10-CM | POA: Diagnosis not present

## 2024-09-15 DIAGNOSIS — M1990 Unspecified osteoarthritis, unspecified site: Secondary | ICD-10-CM | POA: Diagnosis not present

## 2024-09-15 DIAGNOSIS — K640 First degree hemorrhoids: Secondary | ICD-10-CM | POA: Insufficient documentation

## 2024-09-15 DIAGNOSIS — Z1211 Encounter for screening for malignant neoplasm of colon: Secondary | ICD-10-CM | POA: Insufficient documentation

## 2024-09-15 HISTORY — PX: COLONOSCOPY: SHX5424

## 2024-09-15 SURGERY — COLONOSCOPY
Anesthesia: General

## 2024-09-15 MED ORDER — PROPOFOL 500 MG/50ML IV EMUL
INTRAVENOUS | Status: DC | PRN
  Administered 2024-09-15: 75 ug/kg/min via INTRAVENOUS

## 2024-09-15 MED ORDER — PROPOFOL 10 MG/ML IV BOLUS
INTRAVENOUS | Status: DC | PRN
  Administered 2024-09-15: 40 mg via INTRAVENOUS
  Administered 2024-09-15: 20 mg via INTRAVENOUS

## 2024-09-15 MED ORDER — SODIUM CHLORIDE 0.9 % IV SOLN
INTRAVENOUS | Status: DC
  Administered 2024-09-15: 500 mL via INTRAVENOUS

## 2024-09-15 MED ORDER — PROPOFOL 1000 MG/100ML IV EMUL
INTRAVENOUS | Status: AC
  Filled 2024-09-15: qty 100

## 2024-09-15 MED ORDER — LIDOCAINE HCL (CARDIAC) PF 100 MG/5ML IV SOSY
PREFILLED_SYRINGE | INTRAVENOUS | Status: DC | PRN
  Administered 2024-09-15: 60 mg via INTRAVENOUS

## 2024-09-15 MED ORDER — LIDOCAINE HCL (PF) 2 % IJ SOLN
INTRAMUSCULAR | Status: AC
  Filled 2024-09-15: qty 5

## 2024-09-15 MED ORDER — DEXMEDETOMIDINE HCL IN NACL 80 MCG/20ML IV SOLN
INTRAVENOUS | Status: DC | PRN
  Administered 2024-09-15: 12 ug via INTRAVENOUS
  Administered 2024-09-15: 8 ug via INTRAVENOUS

## 2024-09-15 NOTE — Op Note (Signed)
 [facility]enter[facility] Patient Name: Zoe Fields Procedure Date: 09/15/2024 10:40 AM MRN: [id number] Account #: [id number] Date of Birth: 02-25-1956 Admit Type: Outpatient Age: 68 Room: [facility] ENDO ROOM 1 Gender: Female Note Status: Finalized Instrument Name: Peds Colonoscope [id number] Procedure:             Colonoscopy Indications:           Screening for colorectal malignant neoplasm Providers:             Elspeth Ozell Jungling DO, DO Referring MD:          Miriam J. Mclaughlin, MD (Referring MD) Medicines:             Monitored Anesthesia Care Complications:         No immediate complications. Estimated blood loss: None. Procedure:             Pre-Anesthesia Assessment:                        - Prior to the procedure, a History and Physical was                         performed, and patient medications and allergies were                         reviewed. The patient is competent. The risks and                         benefits of the procedure and the sedation options and                         risks were discussed with the patient. All questions                         were answered and informed consent was obtained.                         Patient identification and proposed procedure were                         verified by the physician, the nurse, the anesthetist                         and the technician in the endoscopy suite. Mental                         Status Examination: alert and oriented. Airway                         Examination: normal oropharyngeal airway and neck                         mobility. Respiratory Examination: clear to                         auscultation. CV Examination: RRR, no murmurs, no S3                         or S4. Prophylactic Antibiotics: The patient does not  require prophylactic antibiotics. Prior                         Anticoagulants: The patient has taken no anticoagulant                          or antiplatelet agents. ASA Grade Assessment: II - A                         patient with mild systemic disease. After reviewing                         the risks and benefits, the patient was deemed in                         satisfactory condition to undergo the procedure. The                         anesthesia plan was to use monitored anesthesia care                         (MAC). Immediately prior to administration of                         medications, the patient was re-assessed for adequacy                         to receive sedatives. The heart rate, respiratory                         rate, oxygen saturations, blood pressure, adequacy of                         pulmonary ventilation, and response to care were                         monitored throughout the procedure. The physical                         status of the patient was re-assessed after the                         procedure.                        After obtaining informed consent, the colonoscope was                         passed under direct vision. Throughout the procedure,                         the patient's blood pressure, pulse, and oxygen                         saturations were monitored continuously. The                         Colonoscope was introduced through the anus and  advanced to the the cecum, identified by appendiceal                         orifice and ileocecal valve. The colonoscopy was                         performed without difficulty. The patient tolerated                         the procedure well. The quality of the bowel                         preparation was evaluated using the BBPS [facility] Bowel                         Preparation Scale) with scores of: Right Colon = 3,                         Transverse Colon = 3 and Left Colon = 3 (entire mucosa                         seen well with no residual staining, small fragments                         of stool  or opaque liquid). The total BBPS score                         equals 9. The ileocecal valve, appendiceal orifice,                         and rectum were photographed. Findings:      The perianal and digital rectal examinations were normal. Pertinent       negatives include normal sphincter tone.      A few small-mouthed diverticula were found in the ascending colon.       Estimated blood loss: none.      Non-bleeding internal hemorrhoids were found during retroflexion. The       hemorrhoids were Grade I (internal hemorrhoids that do not prolapse).       Estimated blood loss: none.      The exam was otherwise without abnormality on direct and retroflexion       views. Impression:            - Diverticulosis in the ascending colon.                        - Non-bleeding internal hemorrhoids.                        - The examination was otherwise normal on direct and                         retroflexion views.                        - No specimens collected. Recommendation:        - Patient has a contact number available for  emergencies. The signs and symptoms of potential                         delayed complications were discussed with the patient.                         Return to normal activities tomorrow. Written                         discharge instructions were provided to the patient.                        - Discharge patient to home.                        - Resume previous diet.                        - Continue present medications.                        - Repeat colonoscopy in 10 years for screening                         purposes.                        - Return to referring physician as previously                         scheduled.                        - The findings and recommendations were discussed with                         the patient. Procedure Code(s):     --- Professional ---                        [phone], Colonoscopy, flexible;  diagnostic, including                         collection of specimen(s) by brushing or washing, when                         performed (separate procedure) Diagnosis Code(s):     --- Professional ---                        Z12.11, Encounter for screening for malignant neoplasm                         of colon                        K64.0, First degree hemorrhoids                        K57.30, Diverticulosis of large intestine without                         perforation or abscess without bleeding CPT copyright 2022 American  Medical Association. All rights reserved. The codes documented in this report are preliminary and upon coder review may  be revised to meet current compliance requirements. Attending Participation:      I personally performed the entire procedure. Elspeth Jungling, DO Elspeth Ozell Jungling DO, DO 09/15/2024 11:14:39 AM This report has been signed electronically. Number of Addenda: 0 Note Initiated On: 09/15/2024 10:40 AM Scope Withdrawal Time: 0 hours 8 minutes 10 seconds  Total Procedure Duration: 0 hours 15 minutes 28 seconds  Estimated Blood Loss:  Estimated blood loss: none.      [facility]

## 2024-09-15 NOTE — Anesthesia Preprocedure Evaluation (Signed)
 Anesthesia Evaluation  Patient identified by MRN, date of birth, ID band Patient awake    Reviewed: Allergy & Precautions, NPO status , Patient's Chart, lab work & pertinent test results  Airway Mallampati: II  TM Distance: >3 FB Neck ROM: full    Dental  (+) Teeth Intact, Caps,    Pulmonary neg pulmonary ROS, asthma , former smoker   Pulmonary exam normal breath sounds clear to auscultation       Cardiovascular Exercise Tolerance: Good hypertension, Pt. on medications negative cardio ROS Normal cardiovascular exam Rhythm:Regular Rate:Normal     Neuro/Psych   Anxiety     negative neurological ROS  negative psych ROS   GI/Hepatic negative GI ROS, Neg liver ROS,GERD  Medicated,,  Endo/Other  negative endocrine ROS    Renal/GU negative Renal ROS  negative genitourinary   Musculoskeletal  (+) Arthritis ,    Abdominal Normal abdominal exam  (+)   Peds negative pediatric ROS (+)  Hematology negative hematology ROS (+)   Anesthesia Other Findings Past Medical History: No date: Anxiety No date: Arthritis No date: Asthma No date: GERD (gastroesophageal reflux disease) No date: Hypertension  Past Surgical History: 04/28/2018: ANTERIOR CERVICAL DECOMP/DISCECTOMY FUSION; N/A     Comment:  Procedure: ANTERIOR CERVICAL DECOMPRESSION/DISCECTOMY               FUSION 3 LEVELS-C6-T1;  Surgeon: Clois Fret, MD;               Location: [facility] ORS;  Service: Neurosurgery;  Laterality:               N/A; No date: BUNIONECTOMY; Bilateral No date: CARPAL TUNNEL RELEASE No date: CARPAL TUNNEL RELEASE; Right No date: ECTOPIC PREGNANCY SURGERY 08/30/2018: SHOULDER ARTHROSCOPY WITH OPEN ROTATOR CUFF REPAIR; Right     Comment:  Procedure: SHOULDER ARTHROSCOPY WITH ROTATOR CUFF               REPAIR, DISTAL CLAVICLE EXCISION, SUBACROMIAL               DECOMPRESSION, SUBPECTORAL BICEPS TENODESIS;  Surgeon:               Tobie Priest, MD;  Location: [facility] ORS;  Service:               Orthopedics;  Laterality: Right; No date: TUBAL LIGATION  BMI    Body Mass Index: 26.08 kg/m      Reproductive/Obstetrics negative OB ROS                              Anesthesia Physical Anesthesia Plan  ASA: 2  Anesthesia Plan: General   Post-op Pain Management:    Induction: Intravenous  PONV Risk Score and Plan: Propofol  infusion and TIVA  Airway Management Planned: Natural Airway and Nasal Cannula  Additional Equipment:   Intra-op Plan:   Post-operative Plan:   Informed Consent: I have reviewed the patients History and Physical, chart, labs and discussed the procedure including the risks, benefits and alternatives for the proposed anesthesia with the patient or authorized representative who has indicated his/her understanding and acceptance.     Dental Advisory Given  Plan Discussed with: CRNA  Anesthesia Plan Comments:         Anesthesia Quick Evaluation

## 2024-09-15 NOTE — Interval H&P Note (Signed)
 History and Physical Interval Note: Preprocedure H&P from 09/15/24  was reviewed and there was no interval change after seeing and examining the patient.  Written consent was obtained from the patient after discussion of risks, benefits, and alternatives. Patient has consented to proceed with Colonoscopy with possible intervention   09/15/2024 10:47 AM  Zoe Fields  has presented today for surgery, with the diagnosis of Screening for colon cancer (Z12.11).  The various methods of treatment have been discussed with the patient and family. After consideration of risks, benefits and other options for treatment, the patient has consented to  Procedure(s): COLONOSCOPY (N/A) as a surgical intervention.  The patient's history has been reviewed, patient examined, no change in status, stable for surgery.  I have reviewed the patient's chart and labs.  Questions were answered to the patient's satisfaction.     Elspeth Ozell Jungling

## 2024-09-15 NOTE — H&P (Signed)
 Pre-Procedure H&P   Patient ID: Zoe Fields is a 68 y.o. female.  Gastroenterology Provider: Elspeth Ozell Jungling, Zoe Fields  Referring Provider: Ida Crimes, PA PCP: Crimes Eva POUR, [place]  Date: 09/15/2024  HPI [name] is a 68 y.o. female who presents today for Colonoscopy for Colorectal cancer screening .  Last underwent colonoscopy in 2014 which was normal aside from ascending colon diverticulosis.  No family history of colon cancer or colon polyps.  Regular bowel movements without melena or hematochezia.  Past Medical History:  Diagnosis Date   Anxiety    Arthritis    Asthma    GERD (gastroesophageal reflux disease)    Hypertension     Past Surgical History:  Procedure Laterality Date   ANTERIOR CERVICAL DECOMP/DISCECTOMY FUSION N/A 04/28/2018   Procedure: ANTERIOR CERVICAL DECOMPRESSION/DISCECTOMY FUSION 3 LEVELS-C6-T1;  Surgeon: Clois Fret, MD;  Location: [facility] ORS;  Service: Neurosurgery;  Laterality: N/A;   BUNIONECTOMY Bilateral    CARPAL TUNNEL RELEASE     CARPAL TUNNEL RELEASE Right    ECTOPIC PREGNANCY SURGERY     SHOULDER ARTHROSCOPY WITH OPEN ROTATOR CUFF REPAIR Right 08/30/2018   Procedure: SHOULDER ARTHROSCOPY WITH ROTATOR CUFF REPAIR, DISTAL CLAVICLE EXCISION, SUBACROMIAL DECOMPRESSION, SUBPECTORAL BICEPS TENODESIS;  Surgeon: Tobie Priest, MD;  Location: [facility] ORS;  Service: Orthopedics;  Laterality: Right;   TUBAL LIGATION      Family History No h/o GI disease or malignancy  Review of Systems  Constitutional:  Negative for activity change, appetite change, chills, diaphoresis, fatigue, fever and unexpected weight change.  HENT:  Negative for trouble swallowing and voice change.   Respiratory:  Negative for shortness of breath and wheezing.   Cardiovascular:  Negative for chest pain, palpitations and leg swelling.  Gastrointestinal:  Negative for abdominal distention, abdominal pain, anal bleeding, blood in stool, constipation,  diarrhea, nausea, rectal pain and vomiting.  Musculoskeletal:  Negative for arthralgias and myalgias.  Skin:  Negative for color change and pallor.  Neurological:  Negative for dizziness, syncope and weakness.  Psychiatric/Behavioral:  Negative for confusion.   All other systems reviewed and are negative.    Medications No current facility-administered medications on file prior to encounter.   Current Outpatient Medications on File Prior to Encounter  Medication Sig Dispense Refill   acetaminophen  (TYLENOL ) 325 MG tablet Take 2 tablets (650 mg total) by mouth every 6 (six) hours as needed for mild pain (or Fever >/= 101).     amLODipine  (NORVASC ) 5 MG tablet Take 5 mg by mouth at bedtime.      Biotin  89999 MCG TABS Take 10,000 mcg by mouth at bedtime.     citalopram  (CELEXA ) 10 MG tablet Take 10 mg by mouth at bedtime.      Dupilumab (DUPIXENT) 200 MG/1. SOAJ Inject 200 mg into the skin every 14 (fourteen) days.     folic acid (FOLVITE) 1 MG tablet Take 1 mg by mouth daily.     gabapentin  (NEURONTIN ) 100 MG tablet Take 100 mg by mouth 3 (three) times daily.     loratadine  (CLARITIN ) 10 MG tablet Take 10 mg by mouth at bedtime as needed for allergies.     meclizine  (ANTIVERT ) 25 MG tablet Take 1 tablet (25 mg total) by mouth 3 (three) times daily as needed for dizziness. 30 tablet 0   methotrexate (RHEUMATREX) 2.5 MG tablet Take 2.5 mg by mouth once a week. Caution:Chemotherapy. Protect from light.     polyvinyl alcohol  (LUBRICANT DROPS) 1.4 % ophthalmic  solution Place 1 drop into both eyes 4 (four) times daily as needed for dry eyes.     amoxicillin -clavulanate (AUGMENTIN ) 875-125 MG tablet Take 1 tablet by mouth every 12 (twelve) hours. (Patient not taking: Reported on 09/14/2024) 14 tablet 0   estradiol  (ESTRACE ) 1 MG tablet Take 1 mg by mouth at bedtime.  (Patient not taking: Reported on 09/14/2024)     medroxyPROGESTERone  (PROVERA ) 2.5 MG tablet Take 2.5 mg by mouth at bedtime.   (Patient not taking: Reported on 09/15/2024)     rosuvastatin  (CRESTOR ) 10 MG tablet Take 10 mg by mouth at bedtime. (Patient not taking: Reported on 09/15/2024)  6    Pertinent medications related to GI and procedure were reviewed by me with the patient prior to the procedure   Current Facility-Administered Medications:    0.9 %  sodium chloride  infusion, , Intravenous, Continuous, Onita Zoe Sharper, Zoe Fields, Last Rate: 20 mL/hr at 09/15/24 1029, 500 mL at 09/15/24 1029  sodium chloride  500 mL (09/15/24 1029)       Allergies  Allergen Reactions   Neomycin     hives   Sulfa Antibiotics     Redness    Benazepril Other (See Comments)    Pt is unaware of reaction type   Eryc [Erythromycin] Other (See Comments)    Pt is unaware of reaction type   Flexeril  [Cyclobenzaprine ] Other (See Comments)    Hallucinate   Hyzaar [Losartan Potassium-Hctz] Swelling   Allergies were reviewed by me prior to the procedure  Objective   Body mass index is 26.08 kg/m. Vitals:   09/15/24 0948 09/15/24 1015  BP:  (!) 152/88  Pulse:  (!) 53  Resp:  16  Temp:  (!) 97.2 F (36.2 C)  TempSrc:  Temporal  SpO2:  99%  Weight: 62.6 kg 62.6 kg  Height: 5' 1 (1.549 m) 5' 1 (1.549 m)     Physical Exam Vitals and nursing note reviewed.  Constitutional:      General: She is not in acute distress.    Appearance: Normal appearance. She is not ill-appearing, toxic-appearing or diaphoretic.  HENT:     Head: Normocephalic and atraumatic.     Nose: Nose normal.     Mouth/Throat:     Mouth: Mucous membranes are moist.     Pharynx: Oropharynx is clear.  Eyes:     General: No scleral icterus.    Extraocular Movements: Extraocular movements intact.  Cardiovascular:     Rate and Rhythm: Regular rhythm. Bradycardia present.     Heart sounds: Normal heart sounds. No murmur heard.    No friction rub. No gallop.  Pulmonary:     Effort: Pulmonary effort is normal. No respiratory distress.     Breath  sounds: Normal breath sounds. No wheezing, rhonchi or rales.  Abdominal:     General: Bowel sounds are normal. There is no distension.     Palpations: Abdomen is soft.     Tenderness: There is no abdominal tenderness. There is no guarding or rebound.  Musculoskeletal:     Cervical back: Neck supple.     Right lower leg: No edema.     Left lower leg: No edema.  Skin:    General: Skin is warm and dry.     Coloration: Skin is not jaundiced or pale.  Neurological:     General: No focal deficit present.     Mental Status: She is alert and oriented to person, place, and time. Mental status is at  baseline.  Psychiatric:        Mood and Affect: Mood normal.        Behavior: Behavior normal.        Thought Content: Thought content normal.        Judgment: Judgment normal.      Assessment:  [name] is a 68 y.o. female  who presents today for Colonoscopy for Colorectal cancer screening .  Plan:  Colonoscopy with possible intervention today  Colonoscopy with possible biopsy, control of bleeding, polypectomy, and interventions as necessary has been discussed with the patient/patient representative. Informed consent was obtained from the patient/patient representative after explaining the indication, nature, and risks of the procedure including but not limited to death, bleeding, perforation, missed neoplasm/lesions, cardiorespiratory compromise, and reaction to medications. Opportunity for questions was given and appropriate answers were provided. Patient/patient representative has verbalized understanding is amenable to undergoing the procedure.   Zoe Ozell Jungling, Zoe Fields  [facility]  Portions of the record may have been created with voice recognition software. Occasional wrong-word or 'sound-a-like' substitutions may have occurred due to the inherent limitations of voice recognition software.  Read the chart carefully and recognize, using context, where  substitutions may have occurred.

## 2024-09-15 NOTE — Transfer of Care (Signed)
 Immediate Anesthesia Transfer of Care Note  Patient: Zoe Fields  Procedure(s) Performed: COLONOSCOPY  Patient Location: PACU  Anesthesia Type:General  Level of Consciousness: sedated  Airway & Oxygen Therapy: Patient Spontanous Breathing  Post-op Assessment: Report given to RN and Post -op Vital signs reviewed and stable  Post vital signs: Reviewed and stable  Last Vitals:  Vitals Value Taken Time  BP    Temp    Pulse 59 09/15/24 11:14  Resp 14 09/15/24 11:14  SpO2 98 % 09/15/24 11:14  Vitals shown include unfiled device data.  Last Pain:  Vitals:   09/15/24 1015  TempSrc: Temporal  PainSc: 0-No pain         Complications: No notable events documented.

## 2024-09-15 NOTE — Care Plan (Signed)
 Pt informed of procedure results via telephone after verifying her DOB.  Opportunity for questions provided and appropriate answers given.  Elspeth EMERSON Jungling, DO [facility] Gastroenter[facility]

## 2024-09-15 NOTE — Anesthesia Postprocedure Evaluation (Signed)
 Anesthesia Post Note  Patient: Zoe Fields  Procedure(s) Performed: COLONOSCOPY  Patient location during evaluation: PACU Anesthesia Type: General Level of consciousness: awake Pain management: satisfactory to patient Vital Signs Assessment: post-procedure vital signs reviewed and stable Respiratory status: nonlabored ventilation Cardiovascular status: blood pressure returned to baseline Anesthetic complications: no   No notable events documented.   Last Vitals:  Vitals:   09/15/24 1124 09/15/24 1134  BP: 114/66 125/79  Pulse: (!) 50 (!) 44  Resp: 18 11  Temp:    SpO2: 100% 100%    Last Pain:  Vitals:   09/15/24 1134  TempSrc:   PainSc: 0-No pain                 VAN STAVEREN,Haadiya Frogge

## 2024-11-15 ENCOUNTER — Other Ambulatory Visit: Payer: Self-pay | Admitting: Physician Assistant

## 2024-11-15 DIAGNOSIS — Z1231 Encounter for screening mammogram for malignant neoplasm of breast: Secondary | ICD-10-CM

## 2025-01-03 ENCOUNTER — Encounter
